# Patient Record
Sex: Female | Born: 1965
Health system: Southern US, Community
[De-identification: ages and names within clinical notes are randomized; demographics above are authoritative.]

## PROBLEM LIST (undated history)

## (undated) DIAGNOSIS — Z923 Personal history of irradiation: Secondary | ICD-10-CM

## (undated) DIAGNOSIS — N289 Disorder of kidney and ureter, unspecified: Secondary | ICD-10-CM

## (undated) DIAGNOSIS — L509 Urticaria, unspecified: Secondary | ICD-10-CM

## (undated) DIAGNOSIS — C801 Malignant (primary) neoplasm, unspecified: Secondary | ICD-10-CM

## (undated) DIAGNOSIS — F32A Depression, unspecified: Secondary | ICD-10-CM

## (undated) DIAGNOSIS — F329 Major depressive disorder, single episode, unspecified: Secondary | ICD-10-CM

## (undated) HISTORY — DX: Urticaria, unspecified: L50.9

## (undated) HISTORY — PX: BREAST LUMPECTOMY: SHX2

## (undated) HISTORY — PX: FINGER SURGERY: SHX640

## (undated) HISTORY — PX: SALPINGOOPHORECTOMY: SHX82

## (undated) HISTORY — PX: KNEE SURGERY: SHX244

---

## 2017-10-04 DIAGNOSIS — Z79811 Long term (current) use of aromatase inhibitors: Secondary | ICD-10-CM | POA: Diagnosis not present

## 2017-10-04 DIAGNOSIS — C50411 Malignant neoplasm of upper-outer quadrant of right female breast: Secondary | ICD-10-CM | POA: Diagnosis not present

## 2017-10-04 DIAGNOSIS — I890 Lymphedema, not elsewhere classified: Secondary | ICD-10-CM | POA: Diagnosis not present

## 2017-10-04 DIAGNOSIS — F418 Other specified anxiety disorders: Secondary | ICD-10-CM | POA: Diagnosis not present

## 2017-10-16 DIAGNOSIS — Z0000 Encounter for general adult medical examination without abnormal findings: Secondary | ICD-10-CM | POA: Diagnosis not present

## 2017-10-16 DIAGNOSIS — Z13220 Encounter for screening for lipoid disorders: Secondary | ICD-10-CM | POA: Diagnosis not present

## 2017-10-16 DIAGNOSIS — E559 Vitamin D deficiency, unspecified: Secondary | ICD-10-CM | POA: Diagnosis not present

## 2017-11-06 DIAGNOSIS — M722 Plantar fascial fibromatosis: Secondary | ICD-10-CM | POA: Diagnosis not present

## 2017-11-06 DIAGNOSIS — M65872 Other synovitis and tenosynovitis, left ankle and foot: Secondary | ICD-10-CM | POA: Diagnosis not present

## 2017-11-14 DIAGNOSIS — C50411 Malignant neoplasm of upper-outer quadrant of right female breast: Secondary | ICD-10-CM | POA: Diagnosis not present

## 2017-11-20 DIAGNOSIS — M722 Plantar fascial fibromatosis: Secondary | ICD-10-CM | POA: Diagnosis not present

## 2017-11-28 DIAGNOSIS — Z1231 Encounter for screening mammogram for malignant neoplasm of breast: Secondary | ICD-10-CM | POA: Diagnosis not present

## 2017-11-30 DIAGNOSIS — M722 Plantar fascial fibromatosis: Secondary | ICD-10-CM | POA: Diagnosis not present

## 2017-11-30 DIAGNOSIS — M65872 Other synovitis and tenosynovitis, left ankle and foot: Secondary | ICD-10-CM | POA: Diagnosis not present

## 2017-12-05 ENCOUNTER — Emergency Department (HOSPITAL_COMMUNITY): Payer: BLUE CROSS/BLUE SHIELD

## 2017-12-05 ENCOUNTER — Emergency Department (HOSPITAL_COMMUNITY)
Admission: EM | Admit: 2017-12-05 | Discharge: 2017-12-05 | Disposition: A | Discharge status: Home / Self Care | Payer: BLUE CROSS/BLUE SHIELD | Attending: Emergency Medicine | Admitting: Emergency Medicine

## 2017-12-05 ENCOUNTER — Encounter (HOSPITAL_COMMUNITY): Payer: Self-pay | Admitting: *Deleted

## 2017-12-05 DIAGNOSIS — M65872 Other synovitis and tenosynovitis, left ankle and foot: Secondary | ICD-10-CM | POA: Diagnosis not present

## 2017-12-05 DIAGNOSIS — J111 Influenza due to unidentified influenza virus with other respiratory manifestations: Secondary | ICD-10-CM

## 2017-12-05 DIAGNOSIS — Z853 Personal history of malignant neoplasm of breast: Secondary | ICD-10-CM | POA: Insufficient documentation

## 2017-12-05 DIAGNOSIS — M722 Plantar fascial fibromatosis: Secondary | ICD-10-CM | POA: Diagnosis not present

## 2017-12-05 DIAGNOSIS — Z79899 Other long term (current) drug therapy: Secondary | ICD-10-CM | POA: Insufficient documentation

## 2017-12-05 DIAGNOSIS — G43909 Migraine, unspecified, not intractable, without status migrainosus: Secondary | ICD-10-CM | POA: Diagnosis not present

## 2017-12-05 DIAGNOSIS — R51 Headache: Secondary | ICD-10-CM | POA: Diagnosis not present

## 2017-12-05 DIAGNOSIS — R69 Illness, unspecified: Secondary | ICD-10-CM

## 2017-12-05 DIAGNOSIS — R519 Headache, unspecified: Secondary | ICD-10-CM

## 2017-12-05 LAB — CSF CELL COUNT WITH DIFFERENTIAL
RBC Count, CSF: 0 /mm3
RBC Count, CSF: 3 /mm3 — ABNORMAL HIGH
TUBE #: 4
Tube #: 1
WBC, CSF: 2 /mm3 (ref 0–5)
WBC, CSF: 2 /mm3 (ref 0–5)

## 2017-12-05 LAB — CBC WITH DIFFERENTIAL/PLATELET
BASOS ABS: 0 10*3/uL (ref 0.0–0.1)
Basophils Relative: 0 %
EOS ABS: 0 10*3/uL (ref 0.0–0.7)
EOS PCT: 0 %
HCT: 37.2 % (ref 36.0–46.0)
Hemoglobin: 12.9 g/dL (ref 12.0–15.0)
Lymphocytes Relative: 8 %
Lymphs Abs: 0.9 10*3/uL (ref 0.7–4.0)
MCH: 32.2 pg (ref 26.0–34.0)
MCHC: 34.7 g/dL (ref 30.0–36.0)
MCV: 92.8 fL (ref 78.0–100.0)
Monocytes Absolute: 1.2 10*3/uL — ABNORMAL HIGH (ref 0.1–1.0)
Monocytes Relative: 11 %
Neutro Abs: 9.2 10*3/uL — ABNORMAL HIGH (ref 1.7–7.7)
Neutrophils Relative %: 81 %
PLATELETS: 182 10*3/uL (ref 150–400)
RBC: 4.01 MIL/uL (ref 3.87–5.11)
RDW: 12.4 % (ref 11.5–15.5)
WBC: 11.4 10*3/uL — AB (ref 4.0–10.5)

## 2017-12-05 LAB — BASIC METABOLIC PANEL
ANION GAP: 9 (ref 5–15)
BUN: 13 mg/dL (ref 6–20)
CO2: 25 mmol/L (ref 22–32)
Calcium: 9 mg/dL (ref 8.9–10.3)
Chloride: 105 mmol/L (ref 101–111)
Creatinine, Ser: 0.86 mg/dL (ref 0.44–1.00)
GFR calc Af Amer: >60 mL/min (ref >60)
Glucose, Bld: 113 mg/dL — ABNORMAL HIGH (ref 65–99)
POTASSIUM: 3.8 mmol/L (ref 3.5–5.1)
SODIUM: 139 mmol/L (ref 135–145)

## 2017-12-05 LAB — PROTEIN AND GLUCOSE, CSF
GLUCOSE CSF: 66 mg/dL (ref 40–70)
TOTAL PROTEIN, CSF: 22 mg/dL (ref 15–45)

## 2017-12-05 MED ORDER — ONDANSETRON 4 MG PO TBDP: 4.0000 mg | ORAL_TABLET | Freq: Once | ORAL | Status: DC

## 2017-12-05 MED ORDER — LIDOCAINE-EPINEPHRINE (PF) 2 %-1:200000 IJ SOLN
INTRAMUSCULAR | Status: AC
  Administered 2017-12-05: 20 mL
  Filled 2017-12-05: qty 20

## 2017-12-05 MED ORDER — DIPHENHYDRAMINE HCL 50 MG/ML IJ SOLN
25.0000 mg | Freq: Once | INTRAMUSCULAR | Status: AC
  Administered 2017-12-05: 25 mg via INTRAVENOUS
  Filled 2017-12-05: qty 1

## 2017-12-05 MED ORDER — SODIUM CHLORIDE 0.9 % IV BOLUS
1000.0000 mL | Freq: Once | INTRAVENOUS | Status: AC
  Administered 2017-12-05: 1000 mL via INTRAVENOUS

## 2017-12-05 MED ORDER — LIDOCAINE-EPINEPHRINE 1 %-1:100000 IJ SOLN
20.0000 mL | Freq: Once | INTRAMUSCULAR | Status: DC
  Filled 2017-12-05: qty 20

## 2017-12-05 MED ORDER — ONDANSETRON 4 MG PO TBDP: ORAL_TABLET | ORAL | 0 refills | Status: DC

## 2017-12-05 MED ORDER — PROCHLORPERAZINE EDISYLATE 10 MG/2ML IJ SOLN
10.0000 mg | Freq: Once | INTRAMUSCULAR | Status: DC
Start: 2017-12-05 — End: 2017-12-06
  Filled 2017-12-05: qty 2

## 2017-12-05 MED ORDER — KETOROLAC TROMETHAMINE 30 MG/ML IJ SOLN
15.0000 mg | Freq: Once | INTRAMUSCULAR | Status: AC
  Administered 2017-12-05: 15 mg via INTRAVENOUS
  Filled 2017-12-05: qty 1

## 2017-12-05 NOTE — ED Triage Notes (Addendum)
Pt complains of migraine x 2 days, neck pain and vomiting starting today. Pt states she has ~2 migraines a month. Pt states she had fever of 101.3 earlier today. Pt tried taking tylenol, but vomited after.

## 2017-12-05 NOTE — ED Provider Notes (Signed)
[facility] DEPT Provider Note   CSN: [id number] Arrival date & time: 12/05/17  1558     History   Chief Complaint Chief Complaint  Patient presents with  . Migraine  . Neck Pain  . Fever    HPI Amanda Martinez is a 52 y.o. female.  52 yo F with a chief complaint of a headache and fever.  The patient's headache started 2 days ago and she relates she had a fever yesterday.  Symptoms have worsened where she has had aches and chills at home.  She is trying to take Tylenol but has been vomiting.  She denies cough or congestion denies abdominal pain denies diarrhea.  Denies sick contacts denies tick bite.  She has a history of migraines but thinks that this feels different, this has more severe photophobia as well as vomiting and the headache is in a different location from her prior.  She does have a history of breast cancer and is on antiestrogen therapy.  She denies trauma.  She feels that the pain radiates down to her neck.  The history is provided by the patient.  Migraine  This is a new problem. The current episode started 2 days ago. The problem occurs constantly. The problem has been rapidly worsening. Associated symptoms include headaches. Pertinent negatives include no chest pain, no abdominal pain and no shortness of breath. Exacerbated by: bright lights. Nothing relieves the symptoms. She has tried acetaminophen for the symptoms. The treatment provided no relief.  Neck Pain   Associated symptoms include headaches. Pertinent negatives include no chest pain.  Fever   Associated symptoms include headaches. Pertinent negatives include no chest pain, no vomiting and no congestion.    History reviewed. No pertinent past medical history.  There are no active problems to display for this patient.   History reviewed. No pertinent surgical history.   OB History   None      Home Medications    Prior to Admission medications   Medication Sig Start  Date End Date Taking? Authorizing Provider  ALFALFA PO Take 5 tablets by mouth 2 (two) times daily.   Yes [provider]  Alpha-Lipoic Acid 200 MG CAPS Take 1 capsule by mouth 3 (three) times daily.   Yes [provider]  anastrozole (ARIMIDEX) 1 MG tablet Take 1 mg by mouth daily.  11/14/17  Yes [provider]  Ascorbic Acid (VITAMIN C PO) Take 1 tablet by mouth 3 (three) times daily. Vitamin C 2 (Sustained Release)   Yes [provider]  b complex vitamins tablet Take 3-4 tablets by mouth 2 (two) times daily. Take 4 tables midday and Take 3 tablets in the evening.   Yes [provider]  BLACK COHOSH PO Take 1 capsule by mouth 2 (two) times daily.   Yes [provider]  CALCIUM PO Take 3 tablets by mouth 2 (two) times daily. Osteomatrix   Yes [provider]  cholecalciferol (VITAMIN D) 1000 units tablet Take 1,000-2,000 Units by mouth 3 (three) times daily. Take 1 tablet in the morning, Take 1 tablet at midday and Take 2 tablets in the evening.   Yes [provider]  citalopram (CELEXA) 40 MG tablet Take 40 mg by mouth daily.  11/20/17  Yes [provider]  EQL EVENING PRIMROSE OIL PO Take 1,300 mg by mouth 2 (two) times daily.    Yes [provider]  GARLIC PO Take 1 tablet by mouth 2 (two) times daily.  Yes [provider]  Iron-Vit C-Vit B12-Folic Acid (IRON 235 PLUS PO) Take 1-2 tablets by mouth 2 (two) times daily. Iron Plus. Take 2 tablets at midday and Take 1 tablet in the evening.   Yes [provider]  LECITHIN PO Take 3 capsules by mouth daily.   Yes [provider]  Melatonin 10 MG TABS Take 1 tablet by mouth at bedtime as needed (sleep).   Yes [provider]  Misc Natural Products (JOINT HEALTH) CAPS Take 1 capsule by mouth 3 (three) times daily.   Yes [provider]  Multiple Vitamins-Minerals (ZINC PO) Take 1 tablet by mouth 2 (two) times daily.    Yes [provider]  Omega-3 1000 MG CAPS Take 6,000 mg by mouth daily.   Yes [provider]  OVER THE COUNTER MEDICATION Take 1 capsule by mouth 2 (two) times daily. Carotomax   Yes [provider]  OVER THE COUNTER MEDICATION Take 1 capsule by mouth 2 (two) times daily. CoQHeart   Yes [provider]  OVER THE COUNTER MEDICATION Take 1 capsule by mouth 2 (two) times daily. CorEnergy   Yes [provider]  OVER THE COUNTER MEDICATION Take 3 capsules by mouth daily. GLA   Yes [provider]  OVER THE COUNTER MEDICATION Take 2 capsules by mouth daily. Immunity Formula   Yes [provider]  OVER THE COUNTER MEDICATION Take 1 capsule by mouth daily. Mindworks   Yes [provider]  OVER THE COUNTER MEDICATION Take 1 capsule by mouth 2 (two) times daily. Optiflora   Yes [provider]  OVER THE COUNTER MEDICATION Take 1 tablet by mouth daily. Vitalized Immunity   Yes [provider]  OVER THE COUNTER MEDICATION Take 1 capsule by mouth 2 (two) times daily. Vivix   Yes [provider]  OVER THE COUNTER MEDICATION Take 1 tablet by mouth 2 (two) times daily. Metabolic Boost   Yes [provider]  OVER THE COUNTER MEDICATION Take 2 tablets by mouth 2 (two) times daily. HerbLax   Yes [provider]  OVER THE COUNTER MEDICATION Take 2 tablets by mouth daily. Mental Activity Plus   Yes [provider]  OVER THE COUNTER MEDICATION Take 2 tablets by mouth 2 (two) times daily. Sleep Aid   Yes [provider]  OVER THE COUNTER MEDICATION Take 2 tablets by mouth 2 (two) times daily. VitaLea   Yes [provider]  OVER THE COUNTER MEDICATION Take 1 tablet by mouth 2 (two) times daily. Metabolic Boost.   Yes [provider]  OVER THE COUNTER MEDICATION Take 2 tablets by mouth every evening. DTX (Liver Detox)   Yes [provider]  OVER THE COUNTER  MEDICATION Take 1 tablet by mouth every evening. Mental Activity Plus   Yes [provider]  OVER THE COUNTER MEDICATION Take 1 tablet by mouth every evening. Nutriferon   Yes [provider]  VITAMIN E PO Take 2 capsules by mouth 2 (two) times daily.   Yes [provider]  ondansetron (ZOFRAN ODT) 4 MG disintegrating tablet 4mg  ODT q4 hours prn nausea/vomit 12/05/17   Deno Etienne, DO    Family History No family history on file.  Social History Social History   Tobacco Use  . Smoking status: Not on file  Substance Use Topics  . Alcohol use: Not on file  . Drug use: Not on file     Allergies   Compazine [prochlorperazine] and  Prilosec [omeprazole]   Review of Systems Review of Systems  Constitutional: Positive for fever. Negative for chills.  HENT: Negative for congestion and rhinorrhea.   Eyes: Negative for redness and visual disturbance.  Respiratory: Negative for shortness of breath and wheezing.   Cardiovascular: Negative for chest pain and palpitations.  Gastrointestinal: Negative for abdominal pain, nausea and vomiting.  Genitourinary: Negative for dysuria and urgency.  Musculoskeletal: Positive for neck pain. Negative for arthralgias and myalgias.  Skin: Negative for pallor and wound.  Neurological: Positive for headaches. Negative for dizziness.     Physical Exam Updated Vital Signs BP 110/67   Pulse 71   Temp 99.8 F (37.7 C) (Oral)   Resp 18   SpO2 100%   Physical Exam  Constitutional: She is oriented to person, place, and time. She appears well-developed and well-nourished. No distress.  HENT:  Head: Normocephalic and atraumatic.  Rhinorrhea, swollen turbinates, posterior nasal drip  Eyes: Pupils are equal, round, and reactive to light. EOM are normal.  Neck: Normal range of motion. Neck supple.  Cardiovascular: Normal rate and regular rhythm. Exam reveals no gallop and no friction rub.  No murmur heard. Pulmonary/Chest:  Effort normal. She has no wheezes. She has no rales.  Abdominal: Soft. She exhibits no distension. There is no tenderness.  Musculoskeletal: She exhibits no edema or tenderness.  Neurological: She is alert and oriented to person, place, and time. She has normal strength. No cranial nerve deficit or sensory deficit. Coordination and gait normal. GCS eye subscore is 4. GCS verbal subscore is 5. GCS motor subscore is 6. She displays no Babinski's sign on the right side. She displays no Babinski's sign on the left side.  Reflex Scores:      Tricep reflexes are 2+ on the right side and 2+ on the left side.      Bicep reflexes are 2+ on the right side and 2+ on the left side.      Brachioradialis reflexes are 2+ on the right side and 2+ on the left side.      Patellar reflexes are 2+ on the right side and 2+ on the left side.      Achilles reflexes are 2+ on the right side and 3+ on the left side. Skin: Skin is warm and dry. She is not diaphoretic.  Psychiatric: She has a normal mood and affect. Her behavior is normal.  Nursing note and vitals reviewed.    ED Treatments / Results  Labs (all labs ordered are listed, but only abnormal results are displayed) Labs Reviewed  CBC WITH DIFFERENTIAL/PLATELET - Abnormal; Notable for the following components:      Result Value   WBC 11.4 (*)    Neutro Abs 9.2 (*)    Monocytes Absolute 1.2 (*)    All other components within normal limits  BASIC METABOLIC PANEL - Abnormal; Notable for the following components:   Glucose, Bld 113 (*)    All other components within normal limits  CSF CELL COUNT WITH DIFFERENTIAL - Abnormal; Notable for the following components:   RBC Count, CSF 3 (*)    All other components within normal limits  CSF CULTURE  CSF CELL COUNT WITH DIFFERENTIAL  PROTEIN AND GLUCOSE, CSF    EKG None  Radiology Ct Head Wo Contrast  Result Date: 12/05/2017 CLINICAL DATA:  Migraine headache x2 days EXAM: CT HEAD WITHOUT CONTRAST  TECHNIQUE: Contiguous axial images were obtained from the base of the skull through the vertex without intravenous  contrast. COMPARISON:  None. FINDINGS: Brain: No evidence of acute infarction, hemorrhage, hydrocephalus, extra-axial collection or mass lesion/mass effect. Vascular: No hyperdense vessel or unexpected calcification. Skull: Normal. Negative for fracture or focal lesion. Sinuses/Orbits: The visualized paranasal sinuses are essentially clear. The mastoid air cells are unopacified. Other: None. IMPRESSION: Normal head CT. Electronically Signed   By: Julian Hy M.D.   On: 12/05/2017 18:37    Procedures .Lumbar Puncture Date/Time: 12/05/2017 8:11 PM Performed by: Deno Etienne, DO Authorized by: Deno Etienne, DO   Consent:    Consent obtained:  Verbal   Consent given by:  Patient   Risks discussed:  Bleeding, infection, headache, nerve damage, pain and repeat procedure   Alternatives discussed:  No treatment, delayed treatment, observation and referral Pre-procedure details:    Procedure purpose:  Diagnostic   Preparation: Patient was prepped and draped in usual sterile fashion   Anesthesia (see MAR for exact dosages):    Anesthesia method:  Local infiltration   Local anesthetic:  Lidocaine 2% WITH epi Procedure details:    Lumbar space:  L4-L5 interspace   Patient position:  Sitting   Needle gauge:  20   Needle type:  Spinal needle - Quincke tip   Needle length (in):  3.5   Ultrasound guidance: no     Number of attempts:  1   Fluid appearance:  Clear   Tubes of fluid:  4   Total volume (ml):  8 Post-procedure:    Puncture site:  Adhesive bandage applied   Patient tolerance of procedure:  Tolerated well, no immediate complications   (including critical care time)  Medications Ordered in ED Medications  prochlorperazine (COMPAZINE) injection 10 mg (10 mg Intravenous Refused 12/05/17 1748)  lidocaine-EPINEPHrine (XYLOCAINE W/EPI) 1 %-1:100000 (with pres) injection 20 mL  (20 mLs Intradermal Not Given 12/05/17 1930)  ondansetron (ZOFRAN-ODT) disintegrating tablet 4 mg (4 mg Oral Not Given 12/05/17 2130)  diphenhydrAMINE (BENADRYL) injection 25 mg (25 mg Intravenous Given 12/05/17 1747)  sodium chloride 0.9 % bolus 1,000 mL (0 mLs Intravenous Stopped 12/05/17 1841)  ketorolac (TORADOL) 30 MG/ML injection 15 mg (15 mg Intravenous Given 12/05/17 1748)  lidocaine-EPINEPHrine (XYLOCAINE W/EPI) 2 %-1:200000 (PF) injection (20 mLs  Given 12/05/17 2001)  sodium chloride 0.9 % bolus 1,000 mL (0 mLs Intravenous Stopped 12/05/17 2131)     Initial Impression / Assessment and Plan / ED Course  I have reviewed the triage vital signs and the nursing notes.  Pertinent labs & imaging results that were available during my care of the patient were reviewed by me and considered in my medical decision making (see chart for details).     52 yo  F with a chief complaint of a headache and a fever.  This been going on for the past couple days.  She endorses no other symptoms.  I discussed with her the concern for possible meningitis.  Will obtain a CT of the head with the patient having history of breast cancer prior to the procedure.  More likely I think she has a viral upper respiratory infection with her swollen turbinates and congestion but she is not endorsing this in her history.  We will treat her with a headache cocktail IV fluids labs CT head and reassess.  Patient feeling better she has a mild leukocytosis.  I discussed again the risks and benefits of performing a lumbar puncture.  She would like me to go ahead with the procedure.  Clear fluid removed sent off to the  lab.  2 wbc seen, gram stain without whites or bacteria.  D/c home, treat as viral uri.  PCP follow up.   10:36 PM:  I have discussed the diagnosis/risks/treatment options with the patient and family and believe the pt to be eligible for discharge home to follow-up with PCP. We also discussed returning to the ED immediately if  new or worsening sx occur. We discussed the sx which are most concerning (e.g., sudden worsening pain, fever, inability to tolerate by mouth) that necessitate immediate return. Medications administered to the patient during their visit and any new prescriptions provided to the patient are listed below.  Medications given during this visit Medications  prochlorperazine (COMPAZINE) injection 10 mg (10 mg Intravenous Refused 12/05/17 1748)  lidocaine-EPINEPHrine (XYLOCAINE W/EPI) 1 %-1:100000 (with pres) injection 20 mL (20 mLs Intradermal Not Given 12/05/17 1930)  ondansetron (ZOFRAN-ODT) disintegrating tablet 4 mg (4 mg Oral Not Given 12/05/17 2130)  diphenhydrAMINE (BENADRYL) injection 25 mg (25 mg Intravenous Given 12/05/17 1747)  sodium chloride 0.9 % bolus 1,000 mL (0 mLs Intravenous Stopped 12/05/17 1841)  ketorolac (TORADOL) 30 MG/ML injection 15 mg (15 mg Intravenous Given 12/05/17 1748)  lidocaine-EPINEPHrine (XYLOCAINE W/EPI) 2 %-1:200000 (PF) injection (20 mLs  Given 12/05/17 2001)  sodium chloride 0.9 % bolus 1,000 mL (0 mLs Intravenous Stopped 12/05/17 2131)    The patient appears reasonably screen and/or stabilized for discharge and I doubt any other medical condition or other [facility] requiring further screening, evaluation, or treatment in the ED at this time prior to discharge.    Final Clinical Impressions(s) / ED Diagnoses   Final diagnoses:  Influenza-like illness  Bad headache    ED Discharge Orders        Ordered    ondansetron (ZOFRAN ODT) 4 MG disintegrating tablet     12/05/17 2119       Deno Etienne, DO 12/05/17 2236

## 2017-12-05 NOTE — Discharge Instructions (Signed)
Take tylenol 2 pills 4 times a day and motrin 4 pills 3 times a day.  Drink plenty of fluids.  Return for worsening shortness of breath, headache, confusion. Follow up with your family doctor.   

## 2017-12-09 LAB — CSF CULTURE: CULTURE: NO GROWTH

## 2017-12-09 LAB — CSF CULTURE W GRAM STAIN: Gram Stain: NONE SEEN

## 2017-12-11 ENCOUNTER — Other Ambulatory Visit: Payer: Self-pay

## 2017-12-11 ENCOUNTER — Encounter (HOSPITAL_COMMUNITY): Payer: Self-pay | Admitting: Emergency Medicine

## 2017-12-11 ENCOUNTER — Emergency Department (HOSPITAL_COMMUNITY): Payer: BLUE CROSS/BLUE SHIELD

## 2017-12-11 ENCOUNTER — Inpatient Hospital Stay (HOSPITAL_COMMUNITY): Payer: BLUE CROSS/BLUE SHIELD

## 2017-12-11 ENCOUNTER — Inpatient Hospital Stay (HOSPITAL_COMMUNITY)
Admission: EM | Admit: 2017-12-11 | Discharge: 2017-12-14 | DRG: 690 | Disposition: A | Discharge status: Home / Self Care | Payer: BLUE CROSS/BLUE SHIELD | Attending: Internal Medicine | Admitting: Internal Medicine

## 2017-12-11 DIAGNOSIS — R17 Unspecified jaundice: Secondary | ICD-10-CM | POA: Diagnosis present

## 2017-12-11 DIAGNOSIS — F329 Major depressive disorder, single episode, unspecified: Secondary | ICD-10-CM | POA: Diagnosis present

## 2017-12-11 DIAGNOSIS — H53149 Visual discomfort, unspecified: Secondary | ICD-10-CM | POA: Diagnosis present

## 2017-12-11 DIAGNOSIS — I959 Hypotension, unspecified: Secondary | ICD-10-CM | POA: Diagnosis present

## 2017-12-11 DIAGNOSIS — Z853 Personal history of malignant neoplasm of breast: Secondary | ICD-10-CM

## 2017-12-11 DIAGNOSIS — R1011 Right upper quadrant pain: Secondary | ICD-10-CM

## 2017-12-11 DIAGNOSIS — A419 Sepsis, unspecified organism: Secondary | ICD-10-CM

## 2017-12-11 DIAGNOSIS — D72829 Elevated white blood cell count, unspecified: Secondary | ICD-10-CM | POA: Diagnosis not present

## 2017-12-11 DIAGNOSIS — R502 Drug induced fever: Secondary | ICD-10-CM | POA: Diagnosis not present

## 2017-12-11 DIAGNOSIS — Z79899 Other long term (current) drug therapy: Secondary | ICD-10-CM

## 2017-12-11 DIAGNOSIS — Z888 Allergy status to other drugs, medicaments and biological substances status: Secondary | ICD-10-CM

## 2017-12-11 DIAGNOSIS — E871 Hypo-osmolality and hyponatremia: Secondary | ICD-10-CM | POA: Diagnosis not present

## 2017-12-11 DIAGNOSIS — R945 Abnormal results of liver function studies: Secondary | ICD-10-CM | POA: Diagnosis not present

## 2017-12-11 DIAGNOSIS — N136 Pyonephrosis: Secondary | ICD-10-CM | POA: Diagnosis not present

## 2017-12-11 DIAGNOSIS — D649 Anemia, unspecified: Secondary | ICD-10-CM | POA: Diagnosis present

## 2017-12-11 DIAGNOSIS — E872 Acidosis: Secondary | ICD-10-CM | POA: Diagnosis not present

## 2017-12-11 DIAGNOSIS — R51 Headache: Secondary | ICD-10-CM | POA: Diagnosis present

## 2017-12-11 DIAGNOSIS — R509 Fever, unspecified: Secondary | ICD-10-CM

## 2017-12-11 DIAGNOSIS — N10 Acute tubulo-interstitial nephritis: Secondary | ICD-10-CM | POA: Diagnosis not present

## 2017-12-11 DIAGNOSIS — Z90721 Acquired absence of ovaries, unilateral: Secondary | ICD-10-CM

## 2017-12-11 DIAGNOSIS — B9620 Unspecified Escherichia coli [E. coli] as the cause of diseases classified elsewhere: Secondary | ICD-10-CM | POA: Diagnosis not present

## 2017-12-11 DIAGNOSIS — N179 Acute kidney failure, unspecified: Secondary | ICD-10-CM

## 2017-12-11 DIAGNOSIS — K5730 Diverticulosis of large intestine without perforation or abscess without bleeding: Secondary | ICD-10-CM | POA: Diagnosis not present

## 2017-12-11 DIAGNOSIS — R000 Tachycardia, unspecified: Secondary | ICD-10-CM | POA: Diagnosis not present

## 2017-12-11 DIAGNOSIS — E876 Hypokalemia: Secondary | ICD-10-CM

## 2017-12-11 DIAGNOSIS — R0600 Dyspnea, unspecified: Secondary | ICD-10-CM | POA: Diagnosis not present

## 2017-12-11 HISTORY — DX: Malignant (primary) neoplasm, unspecified: C80.1

## 2017-12-11 HISTORY — DX: Major depressive disorder, single episode, unspecified: F32.9

## 2017-12-11 HISTORY — DX: Depression, unspecified: F32.A

## 2017-12-11 LAB — COMPREHENSIVE METABOLIC PANEL
ALT: 33 U/L (ref 14–54)
AST: 48 U/L — ABNORMAL HIGH (ref 15–41)
Albumin: 2.6 g/dL — ABNORMAL LOW (ref 3.5–5.0)
Alkaline Phosphatase: 299 U/L — ABNORMAL HIGH (ref 38–126)
Anion gap: 13 (ref 5–15)
BUN: 44 mg/dL — ABNORMAL HIGH (ref 6–20)
CO2: 23 mmol/L (ref 22–32)
Calcium: 9.5 mg/dL (ref 8.9–10.3)
Chloride: 98 mmol/L — ABNORMAL LOW (ref 101–111)
Creatinine, Ser: 3.38 mg/dL — ABNORMAL HIGH (ref 0.44–1.00)
GFR calc Af Amer: 17 mL/min — ABNORMAL LOW (ref >60)
GFR calc non Af Amer: 15 mL/min — ABNORMAL LOW (ref >60)
Glucose, Bld: 86 mg/dL (ref 65–99)
Potassium: 3.4 mmol/L — ABNORMAL LOW (ref 3.5–5.1)
Sodium: 134 mmol/L — ABNORMAL LOW (ref 135–145)
Total Bilirubin: 3.6 mg/dL — ABNORMAL HIGH (ref 0.3–1.2)
Total Protein: 6.7 g/dL (ref 6.5–8.1)

## 2017-12-11 LAB — URINALYSIS, ROUTINE W REFLEX MICROSCOPIC
Bilirubin Urine: NEGATIVE
Glucose, UA: NEGATIVE mg/dL
Ketones, ur: NEGATIVE mg/dL
Nitrite: NEGATIVE
Protein, ur: 30 mg/dL — AB
Specific Gravity, Urine: 1.004 — ABNORMAL LOW (ref 1.005–1.030)
WBC, UA: >50 WBC/hpf — ABNORMAL HIGH (ref 0–5)
pH: 5 (ref 5.0–8.0)

## 2017-12-11 LAB — CBC WITH DIFFERENTIAL/PLATELET
Basophils Absolute: 0 10*3/uL (ref 0.0–0.1)
Basophils Relative: 0 %
Eosinophils Absolute: 0.2 10*3/uL (ref 0.0–0.7)
Eosinophils Relative: 1 %
HCT: 34.2 % — ABNORMAL LOW (ref 36.0–46.0)
Hemoglobin: 12.3 g/dL (ref 12.0–15.0)
Lymphocytes Relative: 6 %
Lymphs Abs: 1.3 10*3/uL (ref 0.7–4.0)
MCH: 32.3 pg (ref 26.0–34.0)
MCHC: 36 g/dL (ref 30.0–36.0)
MCV: 89.8 fL (ref 78.0–100.0)
Monocytes Absolute: 1.5 10*3/uL — ABNORMAL HIGH (ref 0.1–1.0)
Monocytes Relative: 7 %
Neutro Abs: 19.1 10*3/uL — ABNORMAL HIGH (ref 1.7–7.7)
Neutrophils Relative %: 86 %
Platelets: 285 10*3/uL (ref 150–400)
RBC: 3.81 MIL/uL — ABNORMAL LOW (ref 3.87–5.11)
RDW: 13.6 % (ref 11.5–15.5)
WBC: 22.1 10*3/uL — ABNORMAL HIGH (ref 4.0–10.5)

## 2017-12-11 LAB — LIPASE, BLOOD: Lipase: 31 U/L (ref 11–51)

## 2017-12-11 LAB — APTT: aPTT: 25 seconds (ref 24–36)

## 2017-12-11 LAB — MAGNESIUM: MAGNESIUM: 2.1 mg/dL (ref 1.7–2.4)

## 2017-12-11 LAB — PROTIME-INR
INR: 1.06
PROTHROMBIN TIME: 13.7 s (ref 11.4–15.2)

## 2017-12-11 LAB — POC URINE PREG, ED: Preg Test, Ur: NEGATIVE

## 2017-12-11 LAB — LACTIC ACID, PLASMA: LACTIC ACID, VENOUS: 1.4 mmol/L (ref 0.5–1.9)

## 2017-12-11 LAB — PROCALCITONIN: PROCALCITONIN: 5.52 ng/mL

## 2017-12-11 MED ORDER — ONDANSETRON HCL 4 MG PO TABS: 4.0000 mg | ORAL_TABLET | Freq: Four times a day (QID) | ORAL | Status: DC | PRN

## 2017-12-11 MED ORDER — ACETAMINOPHEN 650 MG RE SUPP: 650.0000 mg | Freq: Four times a day (QID) | RECTAL | Status: DC | PRN

## 2017-12-11 MED ORDER — HEPARIN SODIUM (PORCINE) 5000 UNIT/ML IJ SOLN
5000.0000 [IU] | Freq: Three times a day (TID) | INTRAMUSCULAR | Status: DC
  Administered 2017-12-11 – 2017-12-13 (×5): 5000 [IU] via SUBCUTANEOUS
  Filled 2017-12-11 (×6): qty 1

## 2017-12-11 MED ORDER — BOOST / RESOURCE BREEZE PO LIQD CUSTOM
1.0000 | Freq: Three times a day (TID) | ORAL | Status: DC
  Administered 2017-12-12 – 2017-12-13 (×5): 1 via ORAL

## 2017-12-11 MED ORDER — POTASSIUM CHLORIDE IN NACL 20-0.9 MEQ/L-% IV SOLN
INTRAVENOUS | Status: DC
  Administered 2017-12-11 – 2017-12-14 (×5): via INTRAVENOUS
  Filled 2017-12-11 (×8): qty 1000

## 2017-12-11 MED ORDER — SODIUM CHLORIDE 0.9 % IV BOLUS
500.0000 mL | Freq: Once | INTRAVENOUS | Status: AC
  Administered 2017-12-11: 500 mL via INTRAVENOUS

## 2017-12-11 MED ORDER — SODIUM CHLORIDE 0.9 % IV SOLN
2.0000 g | INTRAVENOUS | Status: DC
  Administered 2017-12-11: 2 g via INTRAVENOUS
  Filled 2017-12-11: qty 2

## 2017-12-11 MED ORDER — GUAIFENESIN-DM 100-10 MG/5ML PO SYRP: 5.0000 mL | ORAL_SOLUTION | ORAL | Status: DC | PRN

## 2017-12-11 MED ORDER — SODIUM CHLORIDE 0.9 % IV BOLUS
1000.0000 mL | Freq: Once | INTRAVENOUS | Status: AC
  Administered 2017-12-11: 1000 mL via INTRAVENOUS

## 2017-12-11 MED ORDER — ACETAMINOPHEN 500 MG PO TABS: 500.0000 mg | ORAL_TABLET | Freq: Four times a day (QID) | ORAL | Status: DC | PRN

## 2017-12-11 MED ORDER — ONDANSETRON HCL 4 MG/2ML IJ SOLN: 4.0000 mg | Freq: Four times a day (QID) | INTRAMUSCULAR | Status: DC | PRN

## 2017-12-11 MED ORDER — TECHNETIUM TC 99M MEBROFENIN IV KIT
7.8300 | PACK | Freq: Once | INTRAVENOUS | Status: AC | PRN
  Administered 2017-12-11: 7.83 via INTRAVENOUS

## 2017-12-11 MED ORDER — SODIUM CHLORIDE 0.9 % IV SOLN: INTRAVENOUS | Status: DC

## 2017-12-11 MED ORDER — FAMOTIDINE IN NACL 20-0.9 MG/50ML-% IV SOLN
20.0000 mg | Freq: Once | INTRAVENOUS | Status: AC
  Administered 2017-12-11: 20 mg via INTRAVENOUS
  Filled 2017-12-11: qty 50

## 2017-12-11 MED ORDER — ACETAMINOPHEN 325 MG PO TABS
650.0000 mg | ORAL_TABLET | Freq: Four times a day (QID) | ORAL | Status: DC | PRN
  Administered 2017-12-13: 650 mg via ORAL
  Filled 2017-12-11: qty 2

## 2017-12-11 MED ORDER — SODIUM CHLORIDE 0.9 % IV SOLN
1.0000 g | Freq: Once | INTRAVENOUS | Status: DC
  Filled 2017-12-11: qty 10

## 2017-12-11 MED ORDER — MORPHINE 100MG IN NS 100ML (1MG/ML) PREMIX INFUSION: 3.0000 mg/h | Freq: Once | INTRAVENOUS | Status: DC

## 2017-12-11 MED ORDER — MORPHINE SULFATE (PF) 4 MG/ML IV SOLN
3.0000 mg | Freq: Once | INTRAVENOUS | Status: AC
  Administered 2017-12-11: 3 mg via INTRAVENOUS
  Filled 2017-12-11: qty 1

## 2017-12-11 MED ORDER — CITALOPRAM HYDROBROMIDE 20 MG PO TABS
40.0000 mg | ORAL_TABLET | Freq: Every day | ORAL | Status: DC
  Administered 2017-12-12 – 2017-12-14 (×3): 40 mg via ORAL
  Filled 2017-12-11 (×3): qty 2

## 2017-12-11 NOTE — ED Notes (Signed)
Family at bedside. 

## 2017-12-11 NOTE — ED Triage Notes (Signed)
Pt reports that last Wed, day after taking Toradol, she been having itching, fever with headache, abd distention, difficulty breathing, constipation, fatigue and confusion -"like when a direct question is asked to me , I have to think about it".. States that believes these are all side effects of Toradol. Pt able to speak in full sentences

## 2017-12-11 NOTE — ED Provider Notes (Signed)
[facility] DEPT Provider Note   CSN: [id number] Arrival date & time: 12/11/17  7681     History   Chief Complaint Chief Complaint  Patient presents with  . multiple complaints    HPI Amanda Martinez is a 52 y.o. female with history of breast cancer in remission and depression who presents today for evaluation of gradual onset, progressively worsening shortness of breath, abdominal pain, nonproductive cough, and headaches for 5 days.  She was seen and evaluated in the emergency department last Wednesday 6 days ago but states her current symptoms are unrelated.  At that time she underwent an LP to rule out possible meningitis and was found to be stable for discharge home at the time.  Since that time she and her husband have been traveling to [facility], [place], and back to [place].  She developed nausea, vomiting, and diarrhea on Thursday which resolved after 3 days.  She states she contacted her primary care PA at the time who thought that she may have had a gastroenteritis.  She states that the symptoms have improved with BRATdiet.  Presently she is complaining of shortness of breath which worsens with exertion, dull right upper quadrant abdominal pain which does not radiate and worsens with palpation, and low-grade fever.  She states that her normal body temperature is 97.4 F and she has been running an elevated temperature of no more than 99 F.  This is well controlled with ibuprofen and Tylenol.  She notes intermittent retro-orbital headache with associated photophobia which is different from her usual headaches and is dull and throbbing in nature.  Headache worsens with cough.  Cough is nonproductive.  She denies chest pain, urinary urgency, dysuria, hematuria, melena, or hematochezia.  She notes that she has had malodorous urine for 2 days or so and has had right-sided aching low back pain for 10 days.  Pain does not radiate. She also notes that  she feels itchy.  She thinks that her symptoms may have been a result of Toradol which she received in the ED 6 days ago.  She is a non-smoker, does not drink alcohol, and denies recreational drug use.  The history is provided by the patient.    Past Medical History:  Diagnosis Date  . Cancer [facility])    breast   . Depression     Patient Active Problem List   Diagnosis Date Noted  . Acute pyelonephritis 12/11/2017  . AKI (acute kidney injury) ([employer]) 12/11/2017  . Hyponatremia 12/11/2017  . Hypokalemia 12/11/2017    Past Surgical History:  Procedure Laterality Date  . BREAST LUMPECTOMY    . FINGER SURGERY Right    little finger  . KNEE SURGERY Right   . SALPINGOOPHORECTOMY       OB History   None      Home Medications    Prior to Admission medications   Medication Sig Start Date End Date Taking? Authorizing Provider  acetaminophen (TYLENOL) 500 MG tablet Take 500 mg by mouth every 6 (six) hours as needed for headache.   Yes [provider]  ALFALFA PO Take 5 tablets by mouth 2 (two) times daily.   Yes [provider]  Alpha-Lipoic Acid 200 MG CAPS Take 1 capsule by mouth 3 (three) times daily.   Yes [provider]  anastrozole (ARIMIDEX) 1 MG tablet Take 1 mg by mouth daily.  11/14/17  Yes [provider]  Ascorbic Acid (VITAMIN C PO) Take 1 tablet by  mouth 3 (three) times daily. Vitamin C 2 (Sustained Release)   Yes [provider]  b complex vitamins tablet Take 3-4 tablets by mouth 2 (two) times daily. Take 4 tables midday and Take 3 tablets in the evening.   Yes [provider]  BLACK COHOSH PO Take 1 capsule by mouth 2 (two) times daily.   Yes [provider]  CALCIUM PO Take 3 tablets by mouth 2 (two) times daily. Osteomatrix   Yes [provider]  cholecalciferol (VITAMIN D) 1000 units tablet Take 1,000-2,000 Units by mouth 3 (three) times daily. Take 1 tablet in the morning, Take 1 tablet at  midday and Take 2 tablets in the evening.   Yes [provider]  citalopram (CELEXA) 40 MG tablet Take 40 mg by mouth daily.  11/20/17  Yes [provider]  EQL EVENING PRIMROSE OIL PO Take 1,300 mg by mouth 2 (two) times daily.    Yes [provider]  GARLIC PO Take 1 tablet by mouth 2 (two) times daily.   Yes [provider]  ibuprofen (ADVIL,MOTRIN) 200 MG tablet Take 200 mg by mouth every 6 (six) hours as needed for headache.   Yes [provider]  Iron-Vit C-Vit B12-Folic Acid (IRON 573 PLUS PO) Take 1-2 tablets by mouth 2 (two) times daily. Iron Plus. Take 2 tablets at midday and Take 1 tablet in the evening.   Yes [provider]  LECITHIN PO Take 3 capsules by mouth daily.   Yes [provider]  Misc Natural Products (JOINT HEALTH) CAPS Take 1 capsule by mouth 3 (three) times daily.   Yes [provider]  Multiple Vitamins-Minerals (ZINC PO) Take 1 tablet by mouth 2 (two) times daily.   Yes [provider]  Omega-3 1000 MG CAPS Take 6,000 mg by mouth daily.   Yes [provider]  OVER THE COUNTER MEDICATION Take 1 capsule by mouth 2 (two) times daily. Carotomax   Yes [provider]  OVER THE COUNTER MEDICATION Take 1 capsule by mouth 2 (two) times daily. CoQHeart   Yes [provider]  OVER THE COUNTER MEDICATION Take 1 capsule by mouth 2 (two) times daily. CorEnergy   Yes [provider]  OVER THE COUNTER MEDICATION Take 3 capsules by mouth daily. GLA   Yes [provider]  OVER THE COUNTER MEDICATION Take 2 capsules by mouth daily. Immunity Formula   Yes [provider]  OVER THE COUNTER MEDICATION Take 1 capsule by mouth daily. Mindworks   Yes [provider]  OVER THE COUNTER MEDICATION Take 1 capsule by mouth 2 (two) times daily. Optiflora   Yes [provider]  OVER THE COUNTER MEDICATION Take 1 tablet by mouth daily. Vitalized  Immunity   Yes [provider]  OVER THE COUNTER MEDICATION Take 1 capsule by mouth 2 (two) times daily. Vivix   Yes [provider]  OVER THE COUNTER MEDICATION Take 1 tablet by mouth 2 (two) times daily. Metabolic Boost   Yes [provider]  OVER THE COUNTER MEDICATION Take 2 tablets by mouth 2 (two) times daily. HerbLax   Yes [provider]  OVER THE COUNTER MEDICATION Take 2 tablets by mouth daily. Mental Activity Plus   Yes [provider]  OVER THE COUNTER MEDICATION Take 2 tablets by mouth 2 (two) times daily. Sleep Aid   Yes [provider]  OVER THE COUNTER MEDICATION Take 2 tablets by mouth 2 (two) times  daily. VitaLea   Yes [provider]  OVER THE COUNTER MEDICATION Take 2 tablets by mouth every evening. DTX (Liver Detox)   Yes [provider]  OVER THE COUNTER MEDICATION Take 1 tablet by mouth every evening. Mental Activity Plus   Yes [provider]  OVER THE COUNTER MEDICATION Take 1 tablet by mouth every evening. Nutriferon   Yes [provider]  VITAMIN E PO Take 2 capsules by mouth 2 (two) times daily.   Yes [provider]  Melatonin 10 MG TABS Take 1 tablet by mouth at bedtime as needed (sleep).    [provider]  ondansetron (ZOFRAN ODT) 4 MG disintegrating tablet 50m ODT q4 hours prn nausea/vomit 12/05/17   FDeno Etienne DO    Family History No family history on file.  Social History Social History   Tobacco Use  . Smoking status: Never Smoker  . Smokeless tobacco: Never Used  Substance Use Topics  . Alcohol use: Not Currently  . Drug use: Not on file     Allergies   Compazine [prochlorperazine] and Prilosec [omeprazole]   Review of Systems Review of Systems  Constitutional: Positive for fever.  Eyes: Positive for photophobia.  Respiratory: Positive for cough and shortness of breath.   Cardiovascular: Negative for chest pain and leg swelling.    Gastrointestinal: Positive for abdominal distention, abdominal pain, diarrhea (resolved), nausea (resolved) and vomiting (resolved).       +belching  Genitourinary: Negative for dysuria, frequency, hematuria and urgency.  Neurological: Positive for headaches. Negative for syncope and weakness.  All other systems reviewed and are negative.    Physical Exam Updated Vital Signs BP 114/71   Pulse 74   Temp 98.4 F (36.9 C) (Oral)   Resp 11   SpO2 98%   Physical Exam  Constitutional: She is oriented to person, place, and time. She appears well-developed and well-nourished. No distress.  Resting in bed, frequent belching   HENT:  Head: Normocephalic and atraumatic.  Eyes: Pupils are equal, round, and reactive to light. Conjunctivae and EOM are normal. Right eye exhibits no discharge. Left eye exhibits no discharge. No scleral icterus.  Neck: Normal range of motion and full passive range of motion without pain. Neck supple. No JVD present. No neck rigidity. No tracheal deviation present. No Brudzinski's sign and no Kernig's sign noted.  Cardiovascular: Normal rate, regular rhythm, normal heart sounds and intact distal pulses.  2+ radial and DP/PT pulses bl, negative Homan's bl, no LE edema  Pulmonary/Chest: Effort normal and breath sounds normal. No stridor. No respiratory distress. She has no wheezes. She has no rales.  Equal rise and fall of chest, no increased work of breathing.  Abdominal: Soft. Bowel sounds are normal. She exhibits no distension. There is tenderness in the right upper quadrant and epigastric area. There is no rigidity, no rebound and no guarding.  Generalized tenderness to palpation, maximally tender in the right upper quadrant and epigastric regions.  Murphy sign absent, Rovsing's absent, no tenderness to palpation at McBurney's point.  No CVA tenderness.  Musculoskeletal: Normal range of motion. She exhibits no edema or tenderness.  No midline spine TTP, no  paraspinal muscle tenderness, no deformity, crepitus, or step-off noted   Neurological: She is alert and oriented to person, place, and time. No cranial nerve deficit or sensory deficit. She exhibits normal muscle tone.  Mental Status:  Alert, thought content appropriate, able to give a coherent history. Speech fluent without evidence of aphasia. Able  to follow 2 step commands without difficulty.  Cranial Nerves:  II:  Peripheral visual fields grossly normal, pupils equal, round, reactive to light III,IV, VI: ptosis not present, extra-ocular motions intact bilaterally  V,VII: smile symmetric, facial light touch sensation equal VIII: hearing grossly normal to voice  X: uvula elevates symmetrically  XI: bilateral shoulder shrug symmetric and strong XII: midline tongue extension without fassiculations Motor:  Normal tone. 5/5 strength of BUE and BLE major muscle groups including strong and equal grip strength and dorsiflexion/plantar flexion Sensory: light touch normal in all extremities. Cerebellar: normal finger-to-nose with bilateral upper extremities Gait: normal gait and balance. Able to walk on toes and heels with ease.  No pronator drift, no nystasmus.    Skin: Skin is warm and dry. No erythema.  Mild jaundice  Psychiatric: She has a normal mood and affect. Her behavior is normal.  Nursing note and vitals reviewed.    ED Treatments / Results  Labs (all labs ordered are listed, but only abnormal results are displayed) Labs Reviewed  CBC WITH DIFFERENTIAL/PLATELET - Abnormal; Notable for the following components:      Result Value   WBC 22.1 (*)    RBC 3.81 (*)    HCT 34.2 (*)    Neutro Abs 19.1 (*)    Monocytes Absolute 1.5 (*)    All other components within normal limits  COMPREHENSIVE METABOLIC PANEL - Abnormal; Notable for the following components:   Sodium 134 (*)    Potassium 3.4 (*)    Chloride 98 (*)    BUN 44 (*)    Creatinine, Ser 3.38 (*)    Albumin 2.6 (*)      AST 48 (*)    Alkaline Phosphatase 299 (*)    Total Bilirubin 3.6 (*)    GFR calc non Af Amer 15 (*)    GFR calc Af Amer 17 (*)    All other components within normal limits  URINALYSIS, ROUTINE W REFLEX MICROSCOPIC - Abnormal; Notable for the following components:   APPearance CLOUDY (*)    Specific Gravity, Urine 1.004 (*)    Hgb urine dipstick MODERATE (*)    Protein, ur 30 (*)    Leukocytes, UA LARGE (*)    WBC, UA >50 (*)    Bacteria, UA MANY (*)    All other components within normal limits  URINE CULTURE  URINE CULTURE  CULTURE, BLOOD (ROUTINE X 2)  CULTURE, BLOOD (ROUTINE X 2)  LIPASE, BLOOD  HEPATITIS PANEL, ACUTE  HIV ANTIBODY (ROUTINE TESTING)  MAGNESIUM  HEMOGLOBIN A1C  LACTIC ACID, PLASMA  LACTIC ACID, PLASMA  PROCALCITONIN  PROTIME-INR  APTT  CORTISOL  POC URINE PREG, ED    EKG EKG Interpretation  Date/Time:  Tuesday Dec 11 2017 09:39:51 EDT Ventricular Rate:  106 PR Interval:    QRS Duration: 88 QT Interval:  346 QTC Calculation: 460 R Axis:   -37 Text Interpretation:  Sinus tachycardia Probable left atrial enlargement Left axis deviation No old tracing to compare Confirmed by Daleen Bo [phone]) on 12/11/2017 9:54:03 AM   Radiology Dg Chest 2 View  Result Date: 12/11/2017 CLINICAL DATA:  Possible reaction to taking Toradol resulting and difficulty breathing and confusion. EXAM: CHEST - 2 VIEW COMPARISON:  None in PACs FINDINGS: The lungs are adequately inflated. There is no focal infiltrate. There is no pleural effusion. The heart is top-normal in size. The pulmonary vascularity is normal. The mediastinum is normal in width. The trachea is midline. The bony thorax  exhibits no acute abnormality. There is widening of the right AC joint. IMPRESSION: There is no active cardiopulmonary disease. Electronically Signed   By: David  Martinique M.D.   On: 12/11/2017 14:34   Ct Renal Stone Study  Result Date: 12/11/2017 CLINICAL DATA:  Itching and fever.  Headache. Abdominal distention. Elevated creatinine. EXAM: CT ABDOMEN AND PELVIS WITHOUT CONTRAST TECHNIQUE: Multidetector CT imaging of the abdomen and pelvis was performed following the standard protocol without IV contrast. COMPARISON:  None. FINDINGS: Lower chest: No acute abnormality. Hepatobiliary: Probable small cyst in the liver on series 2, image 26, too small to completely characterize. The liver and gallbladder are otherwise normal. Pancreas: Unremarkable. No pancreatic ductal dilatation or surrounding inflammatory changes. Spleen: Normal in size without focal abnormality. Adrenals/Urinary Tract: The adrenal glands are normal. The patient has a horseshoe kidney. There is fat stranding adjacent to both renal moieties, extending down the right pericolic gutter. No hydronephrosis on the right. Mild hydronephrosis on the left is identified with a prominent extrarenal pelvis. No ureterectasis or ureteral stones are identified. Calcifications in the lower abdomen and pelvis appear to be adjacent to rather than within the ureters. The bladder is normal. Stomach/Bowel: The stomach and small bowel are normal. A few scattered colonic diverticuli are seen without diverticulitis. The colon is poorly evaluated due to lack of distention but no obvious abnormalities are noted. The appendix is not seen but there is no secondary evidence of appendicitis. Vascular/Lymphatic: The abdominal aorta is normal in caliber. No atherosclerosis. No lymphadenopathy. Reproductive: Uterus and bilateral adnexa are unremarkable. Other: No free air or free fluid. Musculoskeletal: No acute or significant osseous findings. IMPRESSION: 1. There is fat stranding in the lower abdomen and pelvis, particularly on the right, thought to arise from the kidneys. The lack of hydronephrosis on the right in addition to the lack of renal stones would suggests this stranding is probably not due to a recently passed stone. Pyelonephritis should be  considered. Recommend correlation with urinalysis and physical exam. 2. There is mild hydronephrosis on the left. No obstructing stones are seen. The finding could be due to a UPJ obstruction. The fact that the perinephric stranding is greater on the right than the left suggests that there is not an acute obstruction on the left. Recommend clinical correlation. 3. Scattered colonic diverticuli without diverticulitis. 4. The appendix is not visualized but there is no secondary evidence of appendicitis. Electronically Signed   By: Dorise Bullion III M.D   On: 12/11/2017 16:09   US Abdomen Limited Ruq  Result Date: 12/11/2017 CLINICAL DATA:  Right upper quadrant pain EXAM: ULTRASOUND ABDOMEN LIMITED RIGHT UPPER QUADRANT COMPARISON:  None. FINDINGS: Gallbladder: No gallstones or wall thickening visualized. No sonographic Murphy sign noted by sonographer. Common bile duct: Diameter: 6 mm in caliber. Liver: Simple cyst in the left lobe measures 1.2 cm. No solid mass. Normal in echogenicity. Portal vein is patent on color Doppler imaging with normal direction of blood flow towards the liver. IMPRESSION: No acute pathology involving the liver, gallbladder, or biliary tree. Electronically Signed   By: Marybelle Killings M.D.   On: 12/11/2017 14:55    Procedures Procedures (including critical care time)  Medications Ordered in ED Medications  heparin injection 5,000 Units (has no administration in time range)  acetaminophen (TYLENOL) tablet 650 mg (has no administration in time range)    Or  acetaminophen (TYLENOL) suppository 650 mg (has no administration in time range)  ondansetron (ZOFRAN) tablet 4 mg (has no administration  in time range)    Or  ondansetron (ZOFRAN) injection 4 mg (has no administration in time range)  cefTRIAXone (ROCEPHIN) 2 g in sodium chloride 0.9 % 100 mL IVPB (has no administration in time range)  0.9 % NaCl with KCl 20 mEq/ L  infusion (has no administration in time range)  sodium  chloride 0.9 % bolus 1,000 mL (0 mLs Intravenous Stopped 12/11/17 1631)  famotidine (PEPCID) IVPB 20 mg premix (0 mg Intravenous Stopped 12/11/17 1503)     Initial Impression / Assessment and Plan / ED Course  I have reviewed the triage vital signs and the nursing notes.  Pertinent labs & imaging results that were available during my care of the patient were reviewed by me and considered in my medical decision making (see chart for details).    Patient presents with multiple symptoms including right-sided flank and abdominal pain and resolving nausea, vomiting, and diarrhea.  She also noted shortness of breath but no chest pain.  She is afebrile, mildly hypotensive in the ED with improvement after fluid bolus and ministration.  She does look dehydrated with dry mucous membranes.  Initial EKG showed sinus tachycardia, repeat was normal sinus rhythm with no evidence of ischemic changes.  Chest x-ray shows no acute cardiopulmonary abnormalities.  We will obtain lab work and right upper quadrant ultrasound for further evaluation.  Right upper quadrant ultrasound shows no acute abnormalities to the liver, gallbladder, or biliary tree.  However lab work reviewed by me significant for a leukocytosis of 22.1 and significantly elevated creatinine as compared to her lab work 6 days ago. Today creatinine is 3.38, 0.86 six days ago.  She does have elevated alk phos, total bilirubin, and mildly elevated AST although it is unclear if this is chronic or acute given that lab work 3 days ago did not include LFTs.  Her history is somewhat concerning for hepatitis, we will add a hepatitis panel.  UA suggestive of UTI, will culture. We will also obtain CT renal stone study to evaluate for possible pyelonephritis versus kidney stones.  CT renal stone study shows changes consistent with pyelonephritis.  Will start the patient on Rocephin.  She will require admission given AKI.  Spoke with Dr. Allyson Sabal with [facility] who agrees to assume care of patient and bring her into the hospital for further evaluation and management.  Patient seen and evaluated by Dr. Alvino Chapel who agrees with assessment and plan at this time.   Final Clinical Impressions(s) / ED Diagnoses   Final diagnoses:  RUQ pain  Acute pyelonephritis    ED Discharge Orders    None       Debroah Baller 12/11/17 1655    Davonna Belling, MD 12/12/17 2357

## 2017-12-11 NOTE — ED Notes (Signed)
Bed: WA08 Expected date:  Expected time:  Means of arrival:  Comments: 

## 2017-12-11 NOTE — ED Notes (Signed)
ED TO INPATIENT HANDOFF REPORT  Name/Age/Gender Amanda Martinez 52 y.o. female  Code Status    Code Status Orders  (From admission, onward)        Start     Ordered   12/11/17 1630  Full code  Continuous     12/11/17 1631    Code Status History    This patient has a current code status but no historical code status.      Home/SNF/Other Home  Chief Complaint SHOB weakness  itiching  Level of Care/Admitting Diagnosis ED Disposition    ED Disposition Condition Comment   Admit  Hospital Area: [facility] [[id number]]  Level of Care: Telemetry [5]  Admit to tele based on following criteria: Other see comments  Comments: electrolyte abnormalities  Diagnosis: Acute pyelonephritis [299242]  Admitting Physician: Reyne Dumas [[id number]]  Attending Physician: Reyne Dumas [[id number]]  Estimated length of stay: past midnight tomorrow  Certification:: I certify this patient will need inpatient services for at least 2 midnights  PT Class (Do Not Modify): Inpatient [101]  PT Acc Code (Do Not Modify): Private [1]       Medical History Past Medical History:  Diagnosis Date  . Cancer (HCC)    breast   . Depression     Allergies Allergies  Allergen Reactions  . Compazine [Prochlorperazine] Hives  . Prilosec [Omeprazole] Hives    IV Location/Drains/Wounds Patient Lines/Drains/Airways Status   Active Line/Drains/Airways    Name:   Placement date:   Placement time:   Site:   Days:   Peripheral IV 12/11/17 Left Antecubital   12/11/17    1400    Antecubital   less than 1          Labs/Imaging Results for orders placed or performed during the hospital encounter of 12/11/17 (from the past 48 hour(s))  Urinalysis, Routine w reflex microscopic     Status: Abnormal   Collection Time: 12/11/17  1:34 PM  Result Value Ref Range   Color, Urine YELLOW YELLOW   APPearance CLOUDY (A) CLEAR   Specific Gravity, Urine 1.004 (L) 1.005 - 1.030   pH 5.0 5.0 - 8.0   Glucose, UA NEGATIVE NEGATIVE mg/dL   Hgb urine dipstick MODERATE (A) NEGATIVE   Bilirubin Urine NEGATIVE NEGATIVE   Ketones, ur NEGATIVE NEGATIVE mg/dL   Protein, ur 30 (A) NEGATIVE mg/dL   Nitrite NEGATIVE NEGATIVE   Leukocytes, UA LARGE (A) NEGATIVE   RBC / HPF 0-5 0 - 5 RBC/hpf   WBC, UA >50 (H) 0 - 5 WBC/hpf   Bacteria, UA MANY (A) NONE SEEN   Squamous Epithelial / LPF 0-5 0 - 5    Comment: Please note change in reference range.   Mucus PRESENT     Comment: Performed at [facility], [place] [address] [place], [place] [address]  POC urine preg, ED     Status: None   Collection Time: 12/11/17  1:44 PM  Result Value Ref Range   Preg Test, Ur NEGATIVE NEGATIVE    Comment:        THE SENSITIVITY OF THIS METHODOLOGY IS >24 mIU/mL   CBC with Differential     Status: Abnormal   Collection Time: 12/11/17  2:00 PM  Result Value Ref Range   WBC 22.1 (H) 4.0 - 10.5 K/uL   RBC 3.81 (L) 3.87 - 5.11 MIL/uL   Hemoglobin 12.3 12.0 - 15.0 g/dL   HCT 34.2 (L) 36.0 - 46.0 %   MCV  89.8 78.0 - 100.0 fL   MCH 32.3 26.0 - 34.0 pg   MCHC 36.0 30.0 - 36.0 g/dL   RDW 13.6 11.5 - 15.5 %   Platelets 285 150 - 400 K/uL   Neutrophils Relative % 86 %   Lymphocytes Relative 6 %   Monocytes Relative 7 %   Eosinophils Relative 1 %   Basophils Relative 0 %   Neutro Abs 19.1 (H) 1.7 - 7.7 K/uL   Lymphs Abs 1.3 0.7 - 4.0 K/uL   Monocytes Absolute 1.5 (H) 0.1 - 1.0 K/uL   Eosinophils Absolute 0.2 0.0 - 0.7 K/uL   Basophils Absolute 0.0 0.0 - 0.1 K/uL   WBC Morphology MILD LEFT SHIFT (1-5% METAS, OCC MYELO, OCC BANDS)     Comment: TOXIC GRANULATION Performed at [facility], [place] [address] [place], [place] [address]   Comprehensive metabolic panel     Status: Abnormal   Collection Time: 12/11/17  2:00 PM  Result Value Ref Range   Sodium 134 (L) 135 - 145 mmol/L   Potassium 3.4 (L) 3.5 - 5.1 mmol/L   Chloride 98 (L) 101 - 111 mmol/L   CO2 23 22 - 32 mmol/L    Glucose, Bld 86 65 - 99 mg/dL   BUN 44 (H) 6 - 20 mg/dL   Creatinine, Ser 3.38 (H) 0.44 - 1.00 mg/dL   Calcium 9.5 8.9 - 10.3 mg/dL   Total Protein 6.7 6.5 - 8.1 g/dL   Albumin 2.6 (L) 3.5 - 5.0 g/dL   AST 48 (H) 15 - 41 U/L   ALT 33 14 - 54 U/L   Alkaline Phosphatase 299 (H) 38 - 126 U/L   Total Bilirubin 3.6 (H) 0.3 - 1.2 mg/dL   GFR calc non Af Amer 15 (L) >60 mL/min   GFR calc Af Amer 17 (L) >60 mL/min    Comment: (NOTE) The eGFR has been calculated using the CKD EPI equation. This calculation has not been validated in all clinical situations. eGFR's persistently <60 mL/min signify possible Chronic Kidney Disease.    Anion gap 13 5 - 15    Comment: Performed at [facility], [place] [address], [place], [place] [address]  Lipase, blood     Status: None   Collection Time: 12/11/17  2:00 PM  Result Value Ref Range   Lipase 31 11 - 51 U/L    Comment: Performed at [facility], [place] [address] [place], [place] [address]   Dg Chest 2 View  Result Date: 12/11/2017 CLINICAL DATA:  Possible reaction to taking Toradol resulting and difficulty breathing and confusion. EXAM: CHEST - 2 VIEW COMPARISON:  None in PACs FINDINGS: The lungs are adequately inflated. There is no focal infiltrate. There is no pleural effusion. The heart is top-normal in size. The pulmonary vascularity is normal. The mediastinum is normal in width. The trachea is midline. The bony thorax exhibits no acute abnormality. There is widening of the right AC joint. IMPRESSION: There is no active cardiopulmonary disease. Electronically Signed   By: David  Martinique M.D.   On: 12/11/2017 14:34   Ct Renal Stone Study  Result Date: 12/11/2017 CLINICAL DATA:  Itching and fever. Headache. Abdominal distention. Elevated creatinine. EXAM: CT ABDOMEN AND PELVIS WITHOUT CONTRAST TECHNIQUE: Multidetector CT imaging of the abdomen and pelvis was performed following the standard protocol without IV contrast.  COMPARISON:  None. FINDINGS: Lower chest: No acute abnormality. Hepatobiliary: Probable small cyst in the liver on series 2,  image 26, too small to completely characterize. The liver and gallbladder are otherwise normal. Pancreas: Unremarkable. No pancreatic ductal dilatation or surrounding inflammatory changes. Spleen: Normal in size without focal abnormality. Adrenals/Urinary Tract: The adrenal glands are normal. The patient has a horseshoe kidney. There is fat stranding adjacent to both renal moieties, extending down the right pericolic gutter. No hydronephrosis on the right. Mild hydronephrosis on the left is identified with a prominent extrarenal pelvis. No ureterectasis or ureteral stones are identified. Calcifications in the lower abdomen and pelvis appear to be adjacent to rather than within the ureters. The bladder is normal. Stomach/Bowel: The stomach and small bowel are normal. A few scattered colonic diverticuli are seen without diverticulitis. The colon is poorly evaluated due to lack of distention but no obvious abnormalities are noted. The appendix is not seen but there is no secondary evidence of appendicitis. Vascular/Lymphatic: The abdominal aorta is normal in caliber. No atherosclerosis. No lymphadenopathy. Reproductive: Uterus and bilateral adnexa are unremarkable. Other: No free air or free fluid. Musculoskeletal: No acute or significant osseous findings. IMPRESSION: 1. There is fat stranding in the lower abdomen and pelvis, particularly on the right, thought to arise from the kidneys. The lack of hydronephrosis on the right in addition to the lack of renal stones would suggests this stranding is probably not due to a recently passed stone. Pyelonephritis should be considered. Recommend correlation with urinalysis and physical exam. 2. There is mild hydronephrosis on the left. No obstructing stones are seen. The finding could be due to a UPJ obstruction. The fact that the perinephric stranding  is greater on the right than the left suggests that there is not an acute obstruction on the left. Recommend clinical correlation. 3. Scattered colonic diverticuli without diverticulitis. 4. The appendix is not visualized but there is no secondary evidence of appendicitis. Electronically Signed   By: Dorise Bullion III M.D   On: 12/11/2017 16:09   US Abdomen Limited Ruq  Result Date: 12/11/2017 CLINICAL DATA:  Right upper quadrant pain EXAM: ULTRASOUND ABDOMEN LIMITED RIGHT UPPER QUADRANT COMPARISON:  None. FINDINGS: Gallbladder: No gallstones or wall thickening visualized. No sonographic Murphy sign noted by sonographer. Common bile duct: Diameter: 6 mm in caliber. Liver: Simple cyst in the left lobe measures 1.2 cm. No solid mass. Normal in echogenicity. Portal vein is patent on color Doppler imaging with normal direction of blood flow towards the liver. IMPRESSION: No acute pathology involving the liver, gallbladder, or biliary tree. Electronically Signed   By: Marybelle Killings M.D.   On: 12/11/2017 14:55    Pending Labs Unresulted Labs (From admission, onward)   Start     Ordered   12/12/17 0500  Comprehensive metabolic panel  Tomorrow morning,   R     12/11/17 1631   12/12/17 0500  CBC  Tomorrow morning,   R     12/11/17 1631   12/11/17 1653  Respiratory Panel by PCR  (Respiratory virus panel)  Once,   R     12/11/17 1652   12/11/17 1645  Lactic acid, plasma  STAT Now then every 3 hours,   STAT     12/11/17 1644   12/11/17 1645  Procalcitonin  STAT,   R     12/11/17 1644   12/11/17 1645  Protime-INR  STAT,   R     12/11/17 1644   12/11/17 1645  APTT  STAT,   R     12/11/17 1644   12/11/17 1645  Cortisol  STAT,   R     12/11/17 1644   12/11/17 1633  Culture, blood (routine x 2)  BLOOD CULTURE X 2,   R     12/11/17 1632   12/11/17 1631  Magnesium  Add-on,   R     12/11/17 1631   12/11/17 1631  Urine culture  Once,   R     12/11/17 1631   12/11/17 1631  Hemoglobin A1c  Once,   R      12/11/17 1631   12/11/17 1629  HIV antibody (Routine Testing)  Once,   R     12/11/17 1631   12/11/17 1503  Hepatitis panel, acute  STAT,   STAT     12/11/17 1502   12/11/17 1441  Urine culture  STAT,   STAT     12/11/17 1441      Vitals/Pain Today's Vitals   12/11/17 0939 12/11/17 1256 12/11/17 1357 12/11/17 1608  BP: 102/71 (!) 90/50 115/68 114/71  Pulse: (!) 107 80  74  Resp: '20 18 19 11  ' Temp: 98 F (36.7 C) 98.4 F (36.9 C)    TempSrc: Oral Oral    SpO2: 95% 98%  98%  PainSc:        Isolation Precautions Droplet precaution  Medications Medications  heparin injection 5,000 Units (has no administration in time range)  acetaminophen (TYLENOL) tablet 650 mg (has no administration in time range)    Or  acetaminophen (TYLENOL) suppository 650 mg (has no administration in time range)  ondansetron (ZOFRAN) tablet 4 mg (has no administration in time range)    Or  ondansetron (ZOFRAN) injection 4 mg (has no administration in time range)  cefTRIAXone (ROCEPHIN) 2 g in sodium chloride 0.9 % 100 mL IVPB (has no administration in time range)  0.9 % NaCl with KCl 20 mEq/ L  infusion (has no administration in time range)  guaiFENesin-dextromethorphan (ROBITUSSIN DM) 100-10 MG/5ML syrup 5 mL (has no administration in time range)  sodium chloride 0.9 % bolus 500 mL (has no administration in time range)  sodium chloride 0.9 % bolus 1,000 mL (0 mLs Intravenous Stopped 12/11/17 1631)  famotidine (PEPCID) IVPB 20 mg premix (0 mg Intravenous Stopped 12/11/17 1503)    Mobility walks

## 2017-12-11 NOTE — ED Triage Notes (Signed)
Adds that she had dry cough that hurts her head when she coughs since last Thursday.

## 2017-12-11 NOTE — ED Notes (Signed)
ED Provider at bedside. 

## 2017-12-11 NOTE — ED Notes (Signed)
Tech attempted too draw labs, unsuccessful, only have one arm too work with and the IV is in forearm, attempted to stick in hand but Kurin devices will not pull blood

## 2017-12-11 NOTE — H&P (Addendum)
[facility] History and Physical  Amanda Martinez IHK:[id number] DOB: 12-13-1965 DOA: 12/11/2017  Referring physician:   PCP: Lois Huxley, PA   Chief Complaint:  Dry cough, abdominal pain  HPI:  52 year old female with a past history of depression, breast cancer on multiple over-the-counter supplements, who presents today with gradually worsening shortness of breath, nonproductive cough, abdominal pain and headache for the last 5 days. Patient complains of pain in the right upper quadrant associated with a low-grade fever at home.she currently denies any nausea or vomiting, her diarrhea has now resolved.she states that she's been running a low-grade temperature and: Continues to have retro-orbital headache.she denies any rashes which are new in onset.She has been taking motrin on and off for 5 days along with tylenol  Patient was recently seen in the ER on 12/05/17 with a fever of 101.3, migraines, at that point in time showed a white count of 11.4, CT head was found to be negative, due to concern for meningitis, patient had a lumbar puncture done which was negative and the patient was discharged home from the ED. CSF culture was also negative. she subsequently developed nausea vomiting and diarrhea 3 days ago, which has now resolved.Her primary care provider told her that she had gastroenteritis.  ED course BP (!) 90/50 (BP Location: Left Arm)   Pulse 80   Temp 98.4 F (36.9 C) (Oral)   Resp 18   SpO2 98%  EKG shows sinus tachycardia CT abdomen pelvis shows fat stranding in the lower abdomen and pelvis particularly on the right, suggesting pyelonephritis Right upper quadrant ultrasound was negative Sodium 134, potassium 3.4, creatinine 3.38, alkaline phosphatase 299, total bilirubin 3.6      Review of Systems: negative for the following   complete review of systems was done with pertinent positives as documented in history of present illness     Past Medical History:   Diagnosis Date  . Cancer [facility])    breast   . Depression      Past Surgical History:  Procedure Laterality Date  . BREAST LUMPECTOMY    . FINGER SURGERY Right    little finger  . KNEE SURGERY Right   . SALPINGOOPHORECTOMY        Social History:  reports that she has never smoked. She has never used smokeless tobacco. She reports that she drank alcohol. Her drug history is not on file.    Allergies  Allergen Reactions  . Compazine [Prochlorperazine] Hives  . Prilosec [Omeprazole] Hives        FAMILY HISTORY  When questioned  Directly-patient reports  No family history of HTN, CVA ,DIABETES, TB, Cancer CAD, Bleeding Disorders, Sickle Cell, diabetes, anemia, asthma,   Prior to Admission medications   Medication Sig Start Date End Date Taking? Authorizing Provider  acetaminophen (TYLENOL) 500 MG tablet Take 500 mg by mouth every 6 (six) hours as needed for headache.   Yes [provider]  ALFALFA PO Take 5 tablets by mouth 2 (two) times daily.   Yes [provider]  Alpha-Lipoic Acid 200 MG CAPS Take 1 capsule by mouth 3 (three) times daily.   Yes [provider]  anastrozole (ARIMIDEX) 1 MG tablet Take 1 mg by mouth daily.  11/14/17  Yes [provider]  Ascorbic Acid (VITAMIN C PO) Take 1 tablet by mouth 3 (three) times daily. Vitamin C 2 (Sustained Release)   Yes [provider]  b complex vitamins tablet Take 3-4 tablets by mouth 2 (two)  times daily. Take 4 tables midday and Take 3 tablets in the evening.   Yes [provider]  BLACK COHOSH PO Take 1 capsule by mouth 2 (two) times daily.   Yes [provider]  CALCIUM PO Take 3 tablets by mouth 2 (two) times daily. Osteomatrix   Yes [provider]  cholecalciferol (VITAMIN D) 1000 units tablet Take 1,000-2,000 Units by mouth 3 (three) times daily. Take 1 tablet in the morning, Take 1 tablet at midday and Take 2 tablets in the evening.   Yes [provider]  citalopram (CELEXA) 40 MG tablet Take 40 mg by mouth daily.  11/20/17  Yes [provider]  EQL EVENING PRIMROSE OIL PO Take 1,300 mg by mouth 2 (two) times daily.    Yes [provider]  GARLIC PO Take 1 tablet by mouth 2 (two) times daily.   Yes [provider]  ibuprofen (ADVIL,MOTRIN) 200 MG tablet Take 200 mg by mouth every 6 (six) hours as needed for headache.   Yes [provider]  Iron-Vit C-Vit B12-Folic Acid (IRON 329 PLUS PO) Take 1-2 tablets by mouth 2 (two) times daily. Iron Plus. Take 2 tablets at midday and Take 1 tablet in the evening.   Yes [provider]  LECITHIN PO Take 3 capsules by mouth daily.   Yes [provider]  Misc Natural Products (JOINT HEALTH) CAPS Take 1 capsule by mouth 3 (three) times daily.   Yes [provider]  Multiple Vitamins-Minerals (ZINC PO) Take 1 tablet by mouth 2 (two) times daily.   Yes [provider]  Omega-3 1000 MG CAPS Take 6,000 mg by mouth daily.   Yes [provider]  OVER THE COUNTER MEDICATION Take 1 capsule by mouth 2 (two) times daily. Carotomax   Yes [provider]  OVER THE COUNTER MEDICATION Take 1 capsule by mouth 2 (two) times daily. CoQHeart   Yes [provider]  OVER THE COUNTER MEDICATION Take 1 capsule by mouth 2 (two) times daily. CorEnergy   Yes [provider]  OVER THE COUNTER MEDICATION Take 3 capsules by mouth daily. GLA   Yes [provider]  OVER THE COUNTER MEDICATION Take 2 capsules by mouth daily. Immunity Formula   Yes [provider]  OVER THE COUNTER MEDICATION Take 1 capsule by mouth daily. Mindworks   Yes [provider]  OVER THE COUNTER MEDICATION Take 1 capsule by mouth 2 (two) times daily. Optiflora   Yes [provider]  OVER THE COUNTER MEDICATION Take 1 tablet by mouth daily. Vitalized Immunity   Yes [provider]  OVER THE COUNTER  MEDICATION Take 1 capsule by mouth 2 (two) times daily. Vivix   Yes [provider]  OVER THE COUNTER MEDICATION Take 1 tablet by mouth 2 (two) times daily. Metabolic Boost   Yes [provider]  OVER THE COUNTER MEDICATION Take 2 tablets by mouth 2 (two) times daily. HerbLax   Yes [provider]  OVER THE COUNTER MEDICATION Take 2 tablets by mouth daily. Mental Activity Plus   Yes [provider]  OVER THE COUNTER MEDICATION Take 2 tablets by mouth 2 (two) times daily. Sleep Aid   Yes [provider]  OVER THE COUNTER MEDICATION Take 2 tablets by mouth 2 (two) times daily. VitaLea   Yes [provider]  OVER THE COUNTER MEDICATION Take 2 tablets by mouth every evening. DTX (Liver Detox)   Yes [provider]  OVER THE COUNTER MEDICATION Take 1 tablet by mouth every evening. Mental Activity Plus   Yes [provider]  OVER THE COUNTER MEDICATION Take 1 tablet by mouth every evening. Nutriferon   Yes [provider]  VITAMIN E PO Take 2 capsules by mouth 2 (two) times daily.   Yes [provider]  Melatonin 10 MG TABS Take 1 tablet by mouth at bedtime as needed (sleep).    [provider]  ondansetron (ZOFRAN ODT) 4 MG disintegrating tablet 4mg  ODT q4 hours prn nausea/vomit 12/05/17   Deno Etienne, DO     Physical Exam: Vitals:   12/11/17 0939 12/11/17 1256 12/11/17 1357 12/11/17 1608  BP: 102/71 (!) 90/50 115/68 114/71  Pulse: (!) 107 80  74  Resp: 20 18 19 11   Temp: 98 F (36.7 C) 98.4 F (36.9 C)    TempSrc: Oral Oral    SpO2: 95% 98%  98%        Vitals:   12/11/17 0939 12/11/17 1256 12/11/17 1357 12/11/17 1608  BP: 102/71 (!) 90/50 115/68 114/71  Pulse: (!) 107 80  74  Resp: 20 18 19 11   Temp: 98 F (36.7 C) 98.4 F (36.9 C)    TempSrc: Oral Oral    SpO2: 95% 98%  98%   Constitutional: NAD, calm, comfortable Eyes: PERRL, lids and conjunctivae normal ENMT: Mucous membranes are  moist. Posterior pharynx clear of any exudate or lesions.Normal dentition.  Neck: normal, supple, no masses, no thyromegaly Respiratory: clear to auscultation bilaterally, no wheezing, no crackles. Normal respiratory effort. No accessory muscle use.  Cardiovascular: Regular rate and rhythm, no murmurs / rubs / gallops. No extremity edema. 2+ pedal pulses. No carotid bruits.  Abdomen:   Tenderness to palpation in the right upper quadrant and epigastric area, no masses palpated. No hepatosplenomegaly. Bowel sounds positive.  Musculoskeletal: no clubbing / cyanosis. No joint deformity upper and lower extremities. Good ROM, no contractures. Normal muscle tone.  Skin: no rashes, lesions, ulcers. No induration Neurologic: CN 2-12 grossly intact. Sensation intact, DTR normal. Strength 5/5 in all 4 extremities, cerebellar testing is intact, no pronator drift, no nystagmus Psychiatric: Normal judgment and insight. Alert and oriented x 3. Normal mood.     Labs on Admission: I have personally reviewed following labs and imaging studies  CBC: Recent Labs  Lab 12/05/17 1745 12/11/17 1400  WBC 11.4* 22.1*  NEUTROABS 9.2* 19.1*  HGB 12.9 12.3  HCT 37.2 34.2*  MCV 92.8 89.8  PLT 182 160    Basic Metabolic Panel: Recent Labs  Lab 12/05/17 1745 12/11/17 1400  NA 139 134*  K 3.8 3.4*  CL 105 98*  CO2 25 23  GLUCOSE 113* 86  BUN 13 44*  CREATININE 0.86 3.38*  CALCIUM 9.0 9.5    GFR: CrCl cannot be calculated (Unknown ideal weight.).  Liver Function Tests: Recent Labs  Lab 12/11/17 1400  AST 48*  ALT 33  ALKPHOS 299*  BILITOT 3.6*  PROT 6.7  ALBUMIN 2.6*   Recent Labs  Lab 12/11/17 1400  LIPASE 31   No results for input(s): AMMONIA in the last 168 hours.  Coagulation Profile: No results for input(s): INR, PROTIME in the last 168 hours. No results for input(s): DDIMER in the last 72 hours.  Cardiac Enzymes: No results for input(s): CKTOTAL, CKMB, CKMBINDEX, TROPONINI in  the last 168 hours.  BNP (last 3 results) No results for input(s): PROBNP in the last 8760 hours.  HbA1C: No  results for input(s): HGBA1C in the last 72 hours. No results found for: HGBA1C   CBG: No results for input(s): GLUCAP in the last 168 hours.  Lipid Profile: No results for input(s): CHOL, HDL, LDLCALC, TRIG, CHOLHDL, LDLDIRECT in the last 72 hours.  Thyroid Function Tests: No results for input(s): TSH, T4TOTAL, FREET4, T3FREE, THYROIDAB in the last 72 hours.  Anemia Panel: No results for input(s): VITAMINB12, FOLATE, FERRITIN, TIBC, IRON, RETICCTPCT in the last 72 hours.  Urine analysis:    Component Value Date/Time   COLORURINE YELLOW 12/11/2017 1334   APPEARANCEUR CLOUDY (A) 12/11/2017 1334   LABSPEC 1.004 (L) 12/11/2017 1334   PHURINE 5.0 12/11/2017 1334   GLUCOSEU NEGATIVE 12/11/2017 1334   HGBUR MODERATE (A) 12/11/2017 1334   BILIRUBINUR NEGATIVE 12/11/2017 1334   KETONESUR NEGATIVE 12/11/2017 1334   PROTEINUR 30 (A) 12/11/2017 1334   NITRITE NEGATIVE 12/11/2017 1334   LEUKOCYTESUR LARGE (A) 12/11/2017 1334    Sepsis Labs: @LABRCNTIP (procalcitonin:4,lacticidven:4) ) Recent Results (from the past 240 hour(s))  CSF culture     Status: None   Collection Time: 12/05/17  7:03 PM  Result Value Ref Range Status   Specimen Description CSF  Final   Special Requests   Final    NONE Performed at [facility], [place] [address] [place], [place] [address]    Gram Stain   Final    NO WBC SEEN NO ORGANISMS SEEN Gram Stain Report Called to,Read Back By and Verified With: S DOSTER,RN @2031  12/05/17 MKELLY Performed at [facility], [place] [address], [place], [place] [address]    Culture   Final    NO GROWTH 3 DAYS Performed at [facility], [place] [address], [place], [place] [address]    Report Status 12/09/2017 FINAL  Final         Radiological Exams on Admission: Dg Chest 2 View  Result Date: 12/11/2017 CLINICAL  DATA:  Possible reaction to taking Toradol resulting and difficulty breathing and confusion. EXAM: CHEST - 2 VIEW COMPARISON:  None in PACs FINDINGS: The lungs are adequately inflated. There is no focal infiltrate. There is no pleural effusion. The heart is top-normal in size. The pulmonary vascularity is normal. The mediastinum is normal in width. The trachea is midline. The bony thorax exhibits no acute abnormality. There is widening of the right AC joint. IMPRESSION: There is no active cardiopulmonary disease. Electronically Signed   By: David  Martinique M.D.   On: 12/11/2017 14:34   Ct Renal Stone Study  Result Date: 12/11/2017 CLINICAL DATA:  Itching and fever. Headache. Abdominal distention. Elevated creatinine. EXAM: CT ABDOMEN AND PELVIS WITHOUT CONTRAST TECHNIQUE: Multidetector CT imaging of the abdomen and pelvis was performed following the standard protocol without IV contrast. COMPARISON:  None. FINDINGS: Lower chest: No acute abnormality. Hepatobiliary: Probable small cyst in the liver on series 2, image 26, too small to completely characterize. The liver and gallbladder are otherwise normal. Pancreas: Unremarkable. No pancreatic ductal dilatation or surrounding inflammatory changes. Spleen: Normal in size without focal abnormality. Adrenals/Urinary Tract: The adrenal glands are normal. The patient has a horseshoe kidney. There is fat stranding adjacent to both renal moieties, extending down the right pericolic gutter. No hydronephrosis on the right. Mild hydronephrosis on the left is identified with a prominent extrarenal pelvis. No ureterectasis or ureteral stones are identified. Calcifications in the lower abdomen and pelvis appear to be adjacent to rather than within the ureters. The bladder is normal. Stomach/Bowel: The stomach  and small bowel are normal. A few scattered colonic diverticuli are seen without diverticulitis. The colon is poorly evaluated due to lack of distention but no obvious  abnormalities are noted. The appendix is not seen but there is no secondary evidence of appendicitis. Vascular/Lymphatic: The abdominal aorta is normal in caliber. No atherosclerosis. No lymphadenopathy. Reproductive: Uterus and bilateral adnexa are unremarkable. Other: No free air or free fluid. Musculoskeletal: No acute or significant osseous findings. IMPRESSION: 1. There is fat stranding in the lower abdomen and pelvis, particularly on the right, thought to arise from the kidneys. The lack of hydronephrosis on the right in addition to the lack of renal stones would suggests this stranding is probably not due to a recently passed stone. Pyelonephritis should be considered. Recommend correlation with urinalysis and physical exam. 2. There is mild hydronephrosis on the left. No obstructing stones are seen. The finding could be due to a UPJ obstruction. The fact that the perinephric stranding is greater on the right than the left suggests that there is not an acute obstruction on the left. Recommend clinical correlation. 3. Scattered colonic diverticuli without diverticulitis. 4. The appendix is not visualized but there is no secondary evidence of appendicitis. Electronically Signed   By: Dorise Bullion III M.D   On: 12/11/2017 16:09   US Abdomen Limited Ruq  Result Date: 12/11/2017 CLINICAL DATA:  Right upper quadrant pain EXAM: ULTRASOUND ABDOMEN LIMITED RIGHT UPPER QUADRANT COMPARISON:  None. FINDINGS: Gallbladder: No gallstones or wall thickening visualized. No sonographic Murphy sign noted by sonographer. Common bile duct: Diameter: 6 mm in caliber. Liver: Simple cyst in the left lobe measures 1.2 cm. No solid mass. Normal in echogenicity. Portal vein is patent on color Doppler imaging with normal direction of blood flow towards the liver. IMPRESSION: No acute pathology involving the liver, gallbladder, or biliary tree. Electronically Signed   By: Marybelle Killings M.D.   On: 12/11/2017 14:55   Dg Chest 2  View  Result Date: 12/11/2017 CLINICAL DATA:  Possible reaction to taking Toradol resulting and difficulty breathing and confusion. EXAM: CHEST - 2 VIEW COMPARISON:  None in PACs FINDINGS: The lungs are adequately inflated. There is no focal infiltrate. There is no pleural effusion. The heart is top-normal in size. The pulmonary vascularity is normal. The mediastinum is normal in width. The trachea is midline. The bony thorax exhibits no acute abnormality. There is widening of the right AC joint. IMPRESSION: There is no active cardiopulmonary disease. Electronically Signed   By: David  Martinique M.D.   On: 12/11/2017 14:34   Ct Head Wo Contrast  Result Date: 12/05/2017 CLINICAL DATA:  Migraine headache x2 days EXAM: CT HEAD WITHOUT CONTRAST TECHNIQUE: Contiguous axial images were obtained from the base of the skull through the vertex without intravenous contrast. COMPARISON:  None. FINDINGS: Brain: No evidence of acute infarction, hemorrhage, hydrocephalus, extra-axial collection or mass lesion/mass effect. Vascular: No hyperdense vessel or unexpected calcification. Skull: Normal. Negative for fracture or focal lesion. Sinuses/Orbits: The visualized paranasal sinuses are essentially clear. The mastoid air cells are unopacified. Other: None. IMPRESSION: Normal head CT. Electronically Signed   By: Julian Hy M.D.   On: 12/05/2017 18:37   Ct Renal Stone Study  Result Date: 12/11/2017 CLINICAL DATA:  Itching and fever. Headache. Abdominal distention. Elevated creatinine. EXAM: CT ABDOMEN AND PELVIS WITHOUT CONTRAST TECHNIQUE: Multidetector CT imaging of the abdomen and pelvis was performed following the standard protocol without IV contrast. COMPARISON:  None. FINDINGS: Lower chest: No acute  abnormality. Hepatobiliary: Probable small cyst in the liver on series 2, image 26, too small to completely characterize. The liver and gallbladder are otherwise normal. Pancreas: Unremarkable. No pancreatic ductal  dilatation or surrounding inflammatory changes. Spleen: Normal in size without focal abnormality. Adrenals/Urinary Tract: The adrenal glands are normal. The patient has a horseshoe kidney. There is fat stranding adjacent to both renal moieties, extending down the right pericolic gutter. No hydronephrosis on the right. Mild hydronephrosis on the left is identified with a prominent extrarenal pelvis. No ureterectasis or ureteral stones are identified. Calcifications in the lower abdomen and pelvis appear to be adjacent to rather than within the ureters. The bladder is normal. Stomach/Bowel: The stomach and small bowel are normal. A few scattered colonic diverticuli are seen without diverticulitis. The colon is poorly evaluated due to lack of distention but no obvious abnormalities are noted. The appendix is not seen but there is no secondary evidence of appendicitis. Vascular/Lymphatic: The abdominal aorta is normal in caliber. No atherosclerosis. No lymphadenopathy. Reproductive: Uterus and bilateral adnexa are unremarkable. Other: No free air or free fluid. Musculoskeletal: No acute or significant osseous findings. IMPRESSION: 1. There is fat stranding in the lower abdomen and pelvis, particularly on the right, thought to arise from the kidneys. The lack of hydronephrosis on the right in addition to the lack of renal stones would suggests this stranding is probably not due to a recently passed stone. Pyelonephritis should be considered. Recommend correlation with urinalysis and physical exam. 2. There is mild hydronephrosis on the left. No obstructing stones are seen. The finding could be due to a UPJ obstruction. The fact that the perinephric stranding is greater on the right than the left suggests that there is not an acute obstruction on the left. Recommend clinical correlation. 3. Scattered colonic diverticuli without diverticulitis. 4. The appendix is not visualized but there is no secondary evidence of  appendicitis. Electronically Signed   By: Dorise Bullion III M.D   On: 12/11/2017 16:09   US Abdomen Limited Ruq  Result Date: 12/11/2017 CLINICAL DATA:  Right upper quadrant pain EXAM: ULTRASOUND ABDOMEN LIMITED RIGHT UPPER QUADRANT COMPARISON:  None. FINDINGS: Gallbladder: No gallstones or wall thickening visualized. No sonographic Murphy sign noted by sonographer. Common bile duct: Diameter: 6 mm in caliber. Liver: Simple cyst in the left lobe measures 1.2 cm. No solid mass. Normal in echogenicity. Portal vein is patent on color Doppler imaging with normal direction of blood flow towards the liver. IMPRESSION: No acute pathology involving the liver, gallbladder, or biliary tree. Electronically Signed   By: Marybelle Killings M.D.   On: 12/11/2017 14:55      EKG: Independently reviewed. Sinus tachycardia  Assessment/Plan Principal Problem:   Acute pyelonephritis/acute cholecystitis? Initial presentation and radiologic imaging consistent with acute pyelonephritis Given elevated bilirubin and alkaline phosphatase will order HIDA scan for tomorrow morning Patient has been started on Rocephin for possible pyelonephritis   follow urine culture, blood culture 2 Will initiate focused sepsis order set Follow HIV antibody, acute hepatitis panel Recent CSF fluid culture was negative, no suspicion for meningitis or encephalitis at this point     AKI (acute kidney injury) ([place]) Prerenal versus  secondary to use of NSAIDs as well as over-the-counter medications Will start patient on aggressive IV fluids  Follow strict I's and O's  Depression continue Celexa   Hyponatremia/hypokalemia suspect secondary to dehydration replete     DVT prophylaxis: * heparin     Code Status Orders  (From admission,  onward)        Start     Ordered   12/11/17 1630  Full code  Continuous     12/11/17 1631    Code Status History    This patient has a current code status but no historical code status.        consults called:  Family Communication: Admission, patients condition and plan of care including tests being ordered have been discussed with the patient  who indicates understanding and agree with the plan and Code Status  Admission status: inpatient ????????????????????????  Disposition plan: Further plan will depend as patient's clinical course evolves and further radiologic and laboratory data become available. Likely home when stable   At the time of admission, it appears that the appropriate admission status for this patient is INPATIENT .Thisis judged to be reasonable and necessary in order to provide the required intensity of service to ensure the patient's safetygiven thepresenting symptoms, physical exam findings, and initial radiographic and laboratory data in the context of their chronic comorbidities.   Reyne Dumas MD [facility] Pager [phone]  If 7PM-7AM, please contact night-coverage [email or web address] Password [facility]  12/11/2017, 4:32 PM

## 2017-12-12 LAB — CBC
HCT: 32.7 % — ABNORMAL LOW (ref 36.0–46.0)
Hemoglobin: 11.4 g/dL — ABNORMAL LOW (ref 12.0–15.0)
MCH: 31.3 pg (ref 26.0–34.0)
MCHC: 34.9 g/dL (ref 30.0–36.0)
MCV: 89.8 fL (ref 78.0–100.0)
PLATELETS: 311 10*3/uL (ref 150–400)
RBC: 3.64 MIL/uL — ABNORMAL LOW (ref 3.87–5.11)
RDW: 13.6 % (ref 11.5–15.5)
WBC: 19.4 10*3/uL — ABNORMAL HIGH (ref 4.0–10.5)

## 2017-12-12 LAB — COMPREHENSIVE METABOLIC PANEL
ALK PHOS: 253 U/L — AB (ref 38–126)
ALT: 33 U/L (ref 14–54)
AST: 39 U/L (ref 15–41)
Albumin: 2.2 g/dL — ABNORMAL LOW (ref 3.5–5.0)
Anion gap: 14 (ref 5–15)
BILIRUBIN TOTAL: 2 mg/dL — AB (ref 0.3–1.2)
BUN: 44 mg/dL — ABNORMAL HIGH (ref 6–20)
CALCIUM: 8.7 mg/dL — AB (ref 8.9–10.3)
CO2: 19 mmol/L — AB (ref 22–32)
CREATININE: 2.89 mg/dL — AB (ref 0.44–1.00)
Chloride: 104 mmol/L (ref 101–111)
GFR calc Af Amer: 21 mL/min — ABNORMAL LOW (ref >60)
GFR, EST NON AFRICAN AMERICAN: 18 mL/min — AB (ref >60)
Glucose, Bld: 92 mg/dL (ref 65–99)
Potassium: 3.6 mmol/L (ref 3.5–5.1)
SODIUM: 137 mmol/L (ref 135–145)
TOTAL PROTEIN: 6 g/dL — AB (ref 6.5–8.1)

## 2017-12-12 LAB — RESPIRATORY PANEL BY PCR
ADENOVIRUS-RVPPCR: NOT DETECTED
Bordetella pertussis: NOT DETECTED
CORONAVIRUS 229E-RVPPCR: NOT DETECTED
CORONAVIRUS HKU1-RVPPCR: NOT DETECTED
CORONAVIRUS NL63-RVPPCR: NOT DETECTED
CORONAVIRUS OC43-RVPPCR: NOT DETECTED
Chlamydophila pneumoniae: NOT DETECTED
Influenza A: NOT DETECTED
Influenza B: NOT DETECTED
METAPNEUMOVIRUS-RVPPCR: NOT DETECTED
Mycoplasma pneumoniae: NOT DETECTED
PARAINFLUENZA VIRUS 1-RVPPCR: NOT DETECTED
PARAINFLUENZA VIRUS 2-RVPPCR: NOT DETECTED
Parainfluenza Virus 3: NOT DETECTED
Parainfluenza Virus 4: NOT DETECTED
Respiratory Syncytial Virus: NOT DETECTED
Rhinovirus / Enterovirus: NOT DETECTED

## 2017-12-12 LAB — HEMOGLOBIN A1C
HEMOGLOBIN A1C: 5.2 % (ref 4.8–5.6)
MEAN PLASMA GLUCOSE: 102.54 mg/dL

## 2017-12-12 LAB — HEPATITIS PANEL, ACUTE
HCV Ab: <0.1 s/co ratio (ref 0.0–0.9)
Hep A IgM: NEGATIVE
Hep B C IgM: NEGATIVE
Hepatitis B Surface Ag: NEGATIVE

## 2017-12-12 LAB — HIV ANTIBODY (ROUTINE TESTING W REFLEX): HIV SCREEN 4TH GENERATION: NONREACTIVE

## 2017-12-12 LAB — CORTISOL: CORTISOL PLASMA: 18.5 ug/dL

## 2017-12-12 MED ORDER — SODIUM CHLORIDE 0.9 % IV SOLN
2.0000 g | INTRAVENOUS | Status: DC
  Administered 2017-12-12: 2 g via INTRAVENOUS
  Filled 2017-12-12: qty 2

## 2017-12-12 NOTE — Progress Notes (Signed)
Patient stated that she felt "better after the antibiotic". RN looked to see when Rocephin was due but there is no Rocephin scheduled for tonight.Last Rocephin dose was on 12/11/17. PCP was notified.

## 2017-12-12 NOTE — Progress Notes (Addendum)
PROGRESS NOTE    Amanda Martinez  MRN:[id number] DOB: 06/11/1966 DOA: 12/11/2017 PCP: Becker, Anna G, PA  Brief Narrative:  Per HPI. 52-year-old female with a past history of depression, breast cancer on multiple over-the-counter supplements, who presents today with gradually worsening shortness of breath, nonproductive cough, abdominal pain and headache for the last 5 days. Patient complains of pain in the right upper quadrant associated with a low-grade fever at home.she currently denies any nausea or vomiting, her diarrhea has now resolved.she states that she's been running a low-grade temperature and: Continues to have retro-orbital headache.she denies any rashes which are new in onset.She has been taking motrin on and off for 5 days along with tylenol   She had CT stone study with evidence of possible pyelonephritis.   Assessment & Plan:   Principal Problem:   Acute pyelonephritis Active Problems:   AKI (acute kidney injury) (HCC)   Hyponatremia   Hypokalemia   Acute pyelonephritis: UA with WBC's, bacteria, and +LE CT stone study with evidence of pyelonephritis (mild hydro on the L, but no obstructing stone seen)  Negative HIDA scan (improving bili and alk phos today) Initial presentation and radiologic imaging consistent with acute pyelonephritis Urine cx pending, Blood cx pending Follow cortisol which is pending Follow HIV antibody, acute hepatitis panel (pending) Recent CSF fluid culture was negative, no suspicion for meningitis or encephalitis at this point   AKI (acute kidney injury) (HCC) - Baseline creatinine <1, peaked to 3.38 here.  Improving today with hydration.  Likely 2/2 sepsis due to above.  Possible contribution from dehydration vs use of NSAIDs.  Also mild L sided hydro with possible UPJ obstruction.  Prerenal versus secondary to use of NSAIDs as well as over-the-counter medications. Continue IVF, trend IVF Addendum - discussed possible UPJ obstruction with urology  who recommended continuing abx treatment as noted above, follow kidney function, and outpatient follow up with urology.  Leukocytosis: 2/2 above, improving   Anemia: mild, follow   Elevated Alk phos  Elevated Bilirubin: possibly 2/2 sepsis.  Follow hepatitis panel and HIV studies.  RUQ US without acute pathology.  HIDA scan negative.  Improving.  Continue to follow and w/u additionally as appropriate.   Depression continue Celexa  Hyponatremia/hypokalemia:  Improved, follow  NAGMA:  Likely 2/2 aki and IVF with NS, continue to follow   DVT prophylaxis: heparin  Code Status: full  Family Communication: mother at bedside Disposition Plan:pending further improviement   Consultants:   none  Procedures:   none  Antimicrobials:  Anti-infectives (From admission, onward)   Start     Dose/Rate Route Frequency Ordered Stop   12/11/17 1700  cefTRIAXone (ROCEPHIN) 2 g in sodium chloride 0.9 % 100 mL IVPB     2 g 200 mL/hr over 30 Minutes Intravenous Every 24 hours 12/11/17 1631     12/11/17 1515  cefTRIAXone (ROCEPHIN) 1 g in sodium chloride 0.9 % 100 mL IVPB  Status:  Discontinued     1 g 200 mL/hr over 30 Minutes Intravenous  Once 12/11/17 1507 12/11/17 1633     Subjective: Some abdominal discomfort.   Pain in lower back. Had foul smelling urine for a few days.   No CP or SOB.   Objective: Vitals:   12/11/17 1820 12/11/17 1902 12/11/17 2252 12/12/17 0446  BP: 126/69  125/74 123/74  Pulse: 80  90 83  Resp: 16  16 (!) 22  Temp: 98.4 F (36.9 C)  99.2 F (37.3 C) 97.8 F (36.6 C)    TempSrc: Oral  Oral Oral  SpO2: 100%  96% 97%  Weight:  99.7 kg (219 lb 12.8 oz)    Height:  5' 9" (1.753 m)      Intake/Output Summary (Last 24 hours) at 12/12/2017 0838 Last data filed at 12/12/2017 0600 Gross per 24 hour  Intake 1891.67 ml  Output -  Net 1891.67 ml   Filed Weights   12/11/17 1902  Weight: 99.7 kg (219 lb 12.8 oz)    Examination:  General exam: Appears calm and  comfortable  Respiratory system: Clear to auscultation. Respiratory effort normal. Cardiovascular system: S1 & S2 heard, RRR. No JVD, murmurs, rubs, gallops or clicks. No pedal edema. Gastrointestinal system: Abdomen is nondistended, soft and mildly TTP on R side, mostly lower quadrant. No organomegaly or masses felt. Normal bowel sounds heard. No CVA tenderness, mild R sided lower back pain to palpation.  Central nervous system: Alert and oriented. No focal neurological deficits. Extremities: Symmetric 5 x 5 power. Skin: No rashes, lesions or ulcers Psychiatry: Judgement and insight appear normal. Mood & affect appropriate.     Data Reviewed: I have personally reviewed following labs and imaging studies  CBC: Recent Labs  Lab 12/05/17 1745 12/11/17 1400 12/12/17 0508  WBC 11.4* 22.1* 19.4*  NEUTROABS 9.2* 19.1*  --   HGB 12.9 12.3 11.4*  HCT 37.2 34.2* 32.7*  MCV 92.8 89.8 89.8  PLT 182 285 311   Basic Metabolic Panel: Recent Labs  Lab 12/05/17 1745 12/11/17 1400 12/11/17 2228 12/12/17 0508  NA 139 134*  --  137  K 3.8 3.4*  --  3.6  CL 105 98*  --  104  CO2 25 23  --  19*  GLUCOSE 113* 86  --  92  BUN 13 44*  --  44*  CREATININE 0.86 3.38*  --  2.89*  CALCIUM 9.0 9.5  --  8.7*  MG  --   --  2.1  --    GFR: Estimated Creatinine Clearance: 29.6 mL/min (A) (by C-G formula based on SCr of 2.89 mg/dL (H)). Liver Function Tests: Recent Labs  Lab 12/11/17 1400 12/12/17 0508  AST 48* 39  ALT 33 33  ALKPHOS 299* 253*  BILITOT 3.6* 2.0*  PROT 6.7 6.0*  ALBUMIN 2.6* 2.2*   Recent Labs  Lab 12/11/17 1400  LIPASE 31   No results for input(s): AMMONIA in the last 168 hours. Coagulation Profile: Recent Labs  Lab 12/11/17 2228  INR 1.06   Cardiac Enzymes: No results for input(s): CKTOTAL, CKMB, CKMBINDEX, TROPONINI in the last 168 hours. BNP (last 3 results) No results for input(s): PROBNP in the last 8760 hours. HbA1C: Recent Labs    12/12/17 0508    HGBA1C 5.2   CBG: No results for input(s): GLUCAP in the last 168 hours. Lipid Profile: No results for input(s): CHOL, HDL, LDLCALC, TRIG, CHOLHDL, LDLDIRECT in the last 72 hours. Thyroid Function Tests: No results for input(s): TSH, T4TOTAL, FREET4, T3FREE, THYROIDAB in the last 72 hours. Anemia Panel: No results for input(s): VITAMINB12, FOLATE, FERRITIN, TIBC, IRON, RETICCTPCT in the last 72 hours. Sepsis Labs: Recent Labs  Lab 12/11/17 1905 12/11/17 2228  PROCALCITON  --  5.52  LATICACIDVEN 1.4  --     Recent Results (from the past 240 hour(s))  CSF culture     Status: None   Collection Time: 12/05/17  7:03 PM  Result Value Ref Range Status   Specimen Description CSF  Final   Special Requests     Final    NONE Performed at [facility], [place] [address], [place], [place] [address]    Gram Stain   Final    NO WBC SEEN NO ORGANISMS SEEN Gram Stain Report Called to,Read Back By and Verified With: S DOSTER,RN _0  12/05/17 MKELLY Performed at [facility], [place] [address] [place], [place] [address]    Culture   Final    NO GROWTH 3 DAYS Performed at [facility], [place] [address] [place], [place] [address]    Report Status 12/09/2017 FINAL  Final  Respiratory Panel by PCR     Status: None   Collection Time: 12/11/17  4:53 PM  Result Value Ref Range Status   Adenovirus NOT DETECTED NOT DETECTED Final   Coronavirus 229E NOT DETECTED NOT DETECTED Final   Coronavirus HKU1 NOT DETECTED NOT DETECTED Final   Coronavirus NL63 NOT DETECTED NOT DETECTED Final   Coronavirus OC43 NOT DETECTED NOT DETECTED Final   Metapneumovirus NOT DETECTED NOT DETECTED Final   Rhinovirus / Enterovirus NOT DETECTED NOT DETECTED Final   Influenza A NOT DETECTED NOT DETECTED Final   Influenza B NOT DETECTED NOT DETECTED Final   Parainfluenza Virus 1 NOT DETECTED NOT DETECTED Final   Parainfluenza Virus 2 NOT DETECTED NOT DETECTED Final   Parainfluenza  Virus 3 NOT DETECTED NOT DETECTED Final   Parainfluenza Virus 4 NOT DETECTED NOT DETECTED Final   Respiratory Syncytial Virus NOT DETECTED NOT DETECTED Final   Bordetella pertussis NOT DETECTED NOT DETECTED Final   Chlamydophila pneumoniae NOT DETECTED NOT DETECTED Final   Mycoplasma pneumoniae NOT DETECTED NOT DETECTED Final         Radiology Studies: Dg Chest 2 View  Result Date: 12/11/2017 CLINICAL DATA:  Possible reaction to taking Toradol resulting and difficulty breathing and confusion. EXAM: CHEST - 2 VIEW COMPARISON:  None in PACs FINDINGS: The lungs are adequately inflated. There is no focal infiltrate. There is no pleural effusion. The heart is top-normal in size. The pulmonary vascularity is normal. The mediastinum is normal in width. The trachea is midline. The bony thorax exhibits no acute abnormality. There is widening of the right AC joint. IMPRESSION: There is no active cardiopulmonary disease. Electronically Signed   By: David  Martinique M.D.   On: 12/11/2017 14:34   Nm Hepatobiliary Liver Func  Result Date: 12/11/2017 CLINICAL DATA:  Right upper quadrant abdominal pain. Low-grade fever. EXAM: NUCLEAR MEDICINE HEPATOBILIARY IMAGING TECHNIQUE: Sequential images of the abdomen were obtained out to 60 minutes following intravenous administration of radiopharmaceutical. RADIOPHARMACEUTICALS:  7.83 mCi Tc-1m Choletec IV COMPARISON:  CT abdomen pelvis of the same day. Right upper quadrant ultrasound of the same day. FINDINGS: Prompt uptake and biliary excretion of activity by the liver is seen. Gallbladder was not initially seen. Following administration of 3.0 mg morphine 2 hours after the initial injection, the gallbladder was visualized. Gallbladder activity is visualized, consistent with patency of cystic duct. Biliary activity passes into small bowel, consistent with patent common bile duct. IMPRESSION: Gallbladder opacification following administration of morphine without evidence  for acute or chronic cholecystitis. Electronically Signed   By: CSan MorelleM.D.   On: 12/11/2017 22:20   Ct Renal Stone Study  Result Date: 12/11/2017 CLINICAL DATA:  Itching and fever. Headache. Abdominal distention. Elevated creatinine. EXAM: CT ABDOMEN AND PELVIS WITHOUT CONTRAST TECHNIQUE: Multidetector CT imaging of the abdomen and pelvis was performed following the standard protocol without IV contrast. COMPARISON:  None. FINDINGS: Lower  chest: No acute abnormality. Hepatobiliary: Probable small cyst in the liver on series 2, image 26, too small to completely characterize. The liver and gallbladder are otherwise normal. Pancreas: Unremarkable. No pancreatic ductal dilatation or surrounding inflammatory changes. Spleen: Normal in size without focal abnormality. Adrenals/Urinary Tract: The adrenal glands are normal. The patient has a horseshoe kidney. There is fat stranding adjacent to both renal moieties, extending down the right pericolic gutter. No hydronephrosis on the right. Mild hydronephrosis on the left is identified with a prominent extrarenal pelvis. No ureterectasis or ureteral stones are identified. Calcifications in the lower abdomen and pelvis appear to be adjacent to rather than within the ureters. The bladder is normal. Stomach/Bowel: The stomach and small bowel are normal. A few scattered colonic diverticuli are seen without diverticulitis. The colon is poorly evaluated due to lack of distention but no obvious abnormalities are noted. The appendix is not seen but there is no secondary evidence of appendicitis. Vascular/Lymphatic: The abdominal aorta is normal in caliber. No atherosclerosis. No lymphadenopathy. Reproductive: Uterus and bilateral adnexa are unremarkable. Other: No free air or free fluid. Musculoskeletal: No acute or significant osseous findings. IMPRESSION: 1. There is fat stranding in the lower abdomen and pelvis, particularly on the right, thought to arise from the  kidneys. The lack of hydronephrosis on the right in addition to the lack of renal stones would suggests this stranding is probably not due to a recently passed stone. Pyelonephritis should be considered. Recommend correlation with urinalysis and physical exam. 2. There is mild hydronephrosis on the left. No obstructing stones are seen. The finding could be due to a UPJ obstruction. The fact that the perinephric stranding is greater on the right than the left suggests that there is not an acute obstruction on the left. Recommend clinical correlation. 3. Scattered colonic diverticuli without diverticulitis. 4. The appendix is not visualized but there is no secondary evidence of appendicitis. Electronically Signed   By: David  Williams III M.D   On: 12/11/2017 16:09   Us Abdomen Limited Ruq  Result Date: 12/11/2017 CLINICAL DATA:  Right upper quadrant pain EXAM: ULTRASOUND ABDOMEN LIMITED RIGHT UPPER QUADRANT COMPARISON:  None. FINDINGS: Gallbladder: No gallstones or wall thickening visualized. No sonographic Murphy sign noted by sonographer. Common bile duct: Diameter: 6 mm in caliber. Liver: Simple cyst in the left lobe measures 1.2 cm. No solid mass. Normal in echogenicity. Portal vein is patent on color Doppler imaging with normal direction of blood flow towards the liver. IMPRESSION: No acute pathology involving the liver, gallbladder, or biliary tree. Electronically Signed   By: Arthur  Hoss M.D.   On: 12/11/2017 14:55        Scheduled Meds: . citalopram  40 mg Oral Daily  . feeding supplement  1 Container Oral TID BM  . heparin  5,000 Units Subcutaneous Q8H   Continuous Infusions: . 0.9 % NaCl with KCl 20 mEq / L 100 mL/hr at 12/12/17 0834  . cefTRIAXone (ROCEPHIN)  IV Stopped (12/11/17 2351)     LOS: 1 day    Time spent: over 30 min    Caldwell Powell, MD [facility] Pager [phone]  If 7PM-7AM, please contact night-coverage [email or web address] Password [email or web address] 12/12/2017, 8:38  AM   

## 2017-12-13 DIAGNOSIS — E871 Hypo-osmolality and hyponatremia: Secondary | ICD-10-CM

## 2017-12-13 DIAGNOSIS — E876 Hypokalemia: Secondary | ICD-10-CM

## 2017-12-13 LAB — COMPREHENSIVE METABOLIC PANEL
ALT: 71 U/L — ABNORMAL HIGH (ref 14–54)
AST: 94 U/L — AB (ref 15–41)
Albumin: 1.9 g/dL — ABNORMAL LOW (ref 3.5–5.0)
Alkaline Phosphatase: 257 U/L — ABNORMAL HIGH (ref 38–126)
Anion gap: 11 (ref 5–15)
BILIRUBIN TOTAL: 1.1 mg/dL (ref 0.3–1.2)
BUN: 39 mg/dL — AB (ref 6–20)
CO2: 20 mmol/L — ABNORMAL LOW (ref 22–32)
Calcium: 8.3 mg/dL — ABNORMAL LOW (ref 8.9–10.3)
Chloride: 110 mmol/L (ref 101–111)
Creatinine, Ser: 2.18 mg/dL — ABNORMAL HIGH (ref 0.44–1.00)
GFR calc Af Amer: 29 mL/min — ABNORMAL LOW (ref >60)
GFR, EST NON AFRICAN AMERICAN: 25 mL/min — AB (ref >60)
Glucose, Bld: 108 mg/dL — ABNORMAL HIGH (ref 65–99)
POTASSIUM: 3.9 mmol/L (ref 3.5–5.1)
Sodium: 141 mmol/L (ref 135–145)
TOTAL PROTEIN: 5.4 g/dL — AB (ref 6.5–8.1)

## 2017-12-13 LAB — CBC
HEMATOCRIT: 30 % — AB (ref 36.0–46.0)
Hemoglobin: 10.5 g/dL — ABNORMAL LOW (ref 12.0–15.0)
MCH: 31.2 pg (ref 26.0–34.0)
MCHC: 35 g/dL (ref 30.0–36.0)
MCV: 89 fL (ref 78.0–100.0)
Platelets: 313 10*3/uL (ref 150–400)
RBC: 3.37 MIL/uL — ABNORMAL LOW (ref 3.87–5.11)
RDW: 13.9 % (ref 11.5–15.5)
WBC: 13.1 10*3/uL — AB (ref 4.0–10.5)

## 2017-12-13 LAB — MAGNESIUM: MAGNESIUM: 2 mg/dL (ref 1.7–2.4)

## 2017-12-13 MED ORDER — SODIUM CHLORIDE 0.9 % IV SOLN
1.0000 g | INTRAVENOUS | Status: DC
  Administered 2017-12-13: 1 g via INTRAVENOUS
  Filled 2017-12-13: qty 1

## 2017-12-13 NOTE — Progress Notes (Signed)
Patient ID: Amanda Martinez, female   DOB: 1966/06/09, 52 y.o.   MRN: [id number]  PROGRESS NOTE    Amanda Martinez  WGN:[id number] DOB: 1965/11/25 DOA: 12/11/2017 PCP: Lois Huxley, PA   Brief Narrative:  52 year old female with history of depression, breast cancer presented on 12/11/2017 with abdominal pain and dry cough.  She was found to have probable pyelonephritis on CAT scan of the abdomen and pelvis with acute kidney injury.  She was started on IV antibiotics and IV fluids.  Assessment & Plan:   Principal Problem:   Acute pyelonephritis Active Problems:   AKI (acute kidney injury) ([place])   Hyponatremia   Hypokalemia   Acute pyelonephritis -Continue Rocephin.  Cultures negative so far.  Continue pain management and IV fluids -CT scan of the abdomen showed evidence of pyelonephritis with mild hydronephrosis on the left but no obstructing stone seen: Case was discussed by prior hospitalist with urologist on call on 12/12/2017 who recommended outpatient follow-up with urology  Acute kidney injury -Probably secondary to above. -Improving.  Continue IV fluids.  Repeat a.m. labs  Leukocytosis -Improving.  Repeat a.m. Labs.  Elevated LFTs -Questionable cause.  Improving.  Right upper quadrant ultrasound and HIDA scan was negative.  Might need outpatient GI evaluation and follow-up. -Hepatitis panel negative.  Depression -Continue Celexa  Hypokalemia/hyponatremia -Improved  DVT prophylaxis: Heparin Code Status: Full Family Communication: Mother at bedside Disposition Plan: Probable discharge in 1 to 2 days  Consultants: None  Procedures: None  Antimicrobials: Rocephin from 12/11/2017 onwards   Subjective: Patient seen and examined at bedside.  She denies any overnight fever, nausea or vomiting.  Her appetite is still poor and she still feels weak.  Complains of intermittent abdominal pain.  Objective: Vitals:   12/12/17 1318 12/12/17 2018 12/13/17 0459 12/13/17 0800  BP:  115/75 117/64 121/65 126/77  Pulse: 83 75 69 75  Resp: 18 18 18 18   Temp: 98.2 F (36.8 C) 99.3 F (37.4 C) 98.7 F (37.1 C) 98.1 F (36.7 C)  TempSrc: Oral Oral Oral Oral  SpO2: 99% 99% 96% 98%  Weight:      Height:        Intake/Output Summary (Last 24 hours) at 12/13/2017 1137 Last data filed at 12/13/2017 0944 Gross per 24 hour  Intake 3600 ml  Output -  Net 3600 ml   Filed Weights   12/11/17 1902  Weight: 99.7 kg (219 lb 12.8 oz)    Examination:  General exam: Appears calm and comfortable  Respiratory system: Bilateral decreased breath sound at bases Cardiovascular system: S1 & S2 heard, rate controlled  gastrointestinal system: Abdomen is nondistended, soft and mildly tender in the lower quadrant. Normal bowel sounds heard. Extremities: No cyanosis, clubbing, edema      Data Reviewed: I have personally reviewed following labs and imaging studies  CBC: Recent Labs  Lab 12/11/17 1400 12/12/17 0508 12/13/17 0419  WBC 22.1* 19.4* 13.1*  NEUTROABS 19.1*  --   --   HGB 12.3 11.4* 10.5*  HCT 34.2* 32.7* 30.0*  MCV 89.8 89.8 89.0  PLT 285 311 784   Basic Metabolic Panel: Recent Labs  Lab 12/11/17 1400 12/11/17 2228 12/12/17 0508 12/13/17 0419  NA 134*  --  137 141  K 3.4*  --  3.6 3.9  CL 98*  --  104 110  CO2 23  --  19* 20*  GLUCOSE 86  --  92 108*  BUN 44*  --  44* 39*  CREATININE 3.38*  --  2.89* 2.18*  CALCIUM 9.5  --  8.7* 8.3*  MG  --  2.1  --  2.0   GFR: Estimated Creatinine Clearance: 39.2 mL/min (A) (by C-G formula based on SCr of 2.18 mg/dL (H)). Liver Function Tests: Recent Labs  Lab 12/11/17 1400 12/12/17 0508 12/13/17 0419  AST 48* 39 94*  ALT 33 33 71*  ALKPHOS 299* 253* 257*  BILITOT 3.6* 2.0* 1.1  PROT 6.7 6.0* 5.4*  ALBUMIN 2.6* 2.2* 1.9*   Recent Labs  Lab 12/11/17 1400  LIPASE 31   No results for input(s): AMMONIA in the last 168 hours. Coagulation Profile: Recent Labs  Lab 12/11/17 2228  INR 1.06   Cardiac  Enzymes: No results for input(s): CKTOTAL, CKMB, CKMBINDEX, TROPONINI in the last 168 hours. BNP (last 3 results) No results for input(s): PROBNP in the last 8760 hours. HbA1C: Recent Labs    12/12/17 0508  HGBA1C 5.2   CBG: No results for input(s): GLUCAP in the last 168 hours. Lipid Profile: No results for input(s): CHOL, HDL, LDLCALC, TRIG, CHOLHDL, LDLDIRECT in the last 72 hours. Thyroid Function Tests: No results for input(s): TSH, T4TOTAL, FREET4, T3FREE, THYROIDAB in the last 72 hours. Anemia Panel: No results for input(s): VITAMINB12, FOLATE, FERRITIN, TIBC, IRON, RETICCTPCT in the last 72 hours. Sepsis Labs: Recent Labs  Lab 12/11/17 1905 12/11/17 2228  PROCALCITON  --  5.52  LATICACIDVEN 1.4  --     Recent Results (from the past 240 hour(s))  CSF culture     Status: None   Collection Time: 12/05/17  7:03 PM  Result Value Ref Range Status   Specimen Description CSF  Final   Special Requests   Final    NONE Performed at [facility], [place] [address], [place], [place] [address]    Gram Stain   Final    NO WBC SEEN NO ORGANISMS SEEN Gram Stain Report Called to,Read Back By and Verified With: S DOSTER,RN @2031  12/05/17 MKELLY Performed at [facility], [place] [address] [place], [place] [address]    Culture   Final    NO GROWTH 3 DAYS Performed at [facility], [place] [address] [place], [place] [address]    Report Status 12/09/2017 FINAL  Final  Urine culture     Status: Abnormal (Preliminary result)   Collection Time: 12/11/17  1:34 PM  Result Value Ref Range Status   Specimen Description   Final    URINE, CLEAN CATCH Performed at [facility], [place] [address], [place], [place] [address]    Special Requests   Final    NONE Performed at [facility], [place] [address], [place], [place] [address]    Culture >=100,000 COLONIES/mL ESCHERICHIA COLI (A)  Final   Report Status PENDING   Incomplete  Respiratory Panel by PCR     Status: None   Collection Time: 12/11/17  4:53 PM  Result Value Ref Range Status   Adenovirus NOT DETECTED NOT DETECTED Final   Coronavirus 229E NOT DETECTED NOT DETECTED Final   Coronavirus HKU1 NOT DETECTED NOT DETECTED Final   Coronavirus NL63 NOT DETECTED NOT DETECTED Final   Coronavirus OC43 NOT DETECTED NOT DETECTED Final   Metapneumovirus NOT DETECTED NOT DETECTED Final   Rhinovirus / Enterovirus NOT DETECTED NOT DETECTED Final   Influenza A NOT DETECTED NOT DETECTED Final   Influenza B NOT DETECTED NOT DETECTED Final   Parainfluenza Virus 1 NOT DETECTED NOT DETECTED Final  Parainfluenza Virus 2 NOT DETECTED NOT DETECTED Final   Parainfluenza Virus 3 NOT DETECTED NOT DETECTED Final   Parainfluenza Virus 4 NOT DETECTED NOT DETECTED Final   Respiratory Syncytial Virus NOT DETECTED NOT DETECTED Final   Bordetella pertussis NOT DETECTED NOT DETECTED Final   Chlamydophila pneumoniae NOT DETECTED NOT DETECTED Final   Mycoplasma pneumoniae NOT DETECTED NOT DETECTED Final  Culture, blood (routine x 2)     Status: None (Preliminary result)   Collection Time: 12/11/17  6:41 PM  Result Value Ref Range Status   Specimen Description   Final    BLOOD LEFT HAND Performed at [facility], [place] [address], [place], [place] [address]    Special Requests   Final    BOTTLES DRAWN AEROBIC ONLY Blood Culture adequate volume Performed at [place] [address] [place], [place] [address]    Culture   Final    NO GROWTH < 12 HOURS Performed at [place] [address] [place], [place] [address]    Report Status PENDING  Incomplete  Culture, blood (routine x 2)     Status: None (Preliminary result)   Collection Time: 12/11/17  7:00 PM  Result Value Ref Range Status   Specimen Description   Final    BLOOD LEFT HAND Performed at [place] [address], [place], [place]  [address]    Special Requests   Final    BOTTLES DRAWN AEROBIC ONLY Blood Culture adequate volume Performed at [place] [address], [place],  [address]    Culture   Final    NO GROWTH < 12 HOURS Performed at [place] [address] [place],  [address]    Report Status PENDING  Incomplete         Radiology Studies: Dg Chest 2 View  Result Date: 12/11/2017 CLINICAL DATA:  Possible reaction to taking Toradol resulting and difficulty breathing and confusion. EXAM: CHEST - 2 VIEW COMPARISON:  None in PACs FINDINGS: The lungs are adequately inflated. There is no focal infiltrate. There is no pleural effusion. The heart is top-normal in size. The pulmonary vascularity is normal. The mediastinum is normal in width. The trachea is midline. The bony thorax exhibits no acute abnormality. There is widening of the right AC joint. IMPRESSION: There is no active cardiopulmonary disease. Electronically Signed   By: David  Martinique M.D.   On: 12/11/2017 14:34   Nm Hepatobiliary Liver Func  Result Date: 12/11/2017 CLINICAL DATA:  Right upper quadrant abdominal pain. Low-grade fever. EXAM: NUCLEAR MEDICINE HEPATOBILIARY IMAGING TECHNIQUE: Sequential images of the abdomen were obtained out to 60 minutes following intravenous administration of radiopharmaceutical. RADIOPHARMACEUTICALS:  7.83 mCi Tc-75m  Choletec IV COMPARISON:  CT abdomen pelvis of the same day. Right upper quadrant ultrasound of the same day. FINDINGS: Prompt uptake and biliary excretion of activity by the liver is seen. Gallbladder was not initially seen. Following administration of 3.0 mg morphine 2 hours after the initial injection, the gallbladder was visualized. Gallbladder activity is visualized, consistent with patency of cystic duct. Biliary activity passes into small bowel, consistent with patent common bile duct. IMPRESSION: Gallbladder opacification following administration of morphine  without evidence for acute or chronic cholecystitis. Electronically Signed   By: San Morelle M.D.   On: 12/11/2017 22:20   Ct Renal Stone Study  Result Date: 12/11/2017 CLINICAL DATA:  Itching and fever. Headache. Abdominal distention. Elevated creatinine. EXAM: CT  ABDOMEN AND PELVIS WITHOUT CONTRAST TECHNIQUE: Multidetector CT imaging of the abdomen and pelvis was performed following the standard protocol without IV contrast. COMPARISON:  None. FINDINGS: Lower chest: No acute abnormality. Hepatobiliary: Probable small cyst in the liver on series 2, image 26, too small to completely characterize. The liver and gallbladder are otherwise normal. Pancreas: Unremarkable. No pancreatic ductal dilatation or surrounding inflammatory changes. Spleen: Normal in size without focal abnormality. Adrenals/Urinary Tract: The adrenal glands are normal. The patient has a horseshoe kidney. There is fat stranding adjacent to both renal moieties, extending down the right pericolic gutter. No hydronephrosis on the right. Mild hydronephrosis on the left is identified with a prominent extrarenal pelvis. No ureterectasis or ureteral stones are identified. Calcifications in the lower abdomen and pelvis appear to be adjacent to rather than within the ureters. The bladder is normal. Stomach/Bowel: The stomach and small bowel are normal. A few scattered colonic diverticuli are seen without diverticulitis. The colon is poorly evaluated due to lack of distention but no obvious abnormalities are noted. The appendix is not seen but there is no secondary evidence of appendicitis. Vascular/Lymphatic: The abdominal aorta is normal in caliber. No atherosclerosis. No lymphadenopathy. Reproductive: Uterus and bilateral adnexa are unremarkable. Other: No free air or free fluid. Musculoskeletal: No acute or significant osseous findings. IMPRESSION: 1. There is fat stranding in the lower abdomen and pelvis, particularly on the right, thought  to arise from the kidneys. The lack of hydronephrosis on the right in addition to the lack of renal stones would suggests this stranding is probably not due to a recently passed stone. Pyelonephritis should be considered. Recommend correlation with urinalysis and physical exam. 2. There is mild hydronephrosis on the left. No obstructing stones are seen. The finding could be due to a UPJ obstruction. The fact that the perinephric stranding is greater on the right than the left suggests that there is not an acute obstruction on the left. Recommend clinical correlation. 3. Scattered colonic diverticuli without diverticulitis. 4. The appendix is not visualized but there is no secondary evidence of appendicitis. Electronically Signed   By: Dorise Bullion III M.D   On: 12/11/2017 16:09   US Abdomen Limited Ruq  Result Date: 12/11/2017 CLINICAL DATA:  Right upper quadrant pain EXAM: ULTRASOUND ABDOMEN LIMITED RIGHT UPPER QUADRANT COMPARISON:  None. FINDINGS: Gallbladder: No gallstones or wall thickening visualized. No sonographic Murphy sign noted by sonographer. Common bile duct: Diameter: 6 mm in caliber. Liver: Simple cyst in the left lobe measures 1.2 cm. No solid mass. Normal in echogenicity. Portal vein is patent on color Doppler imaging with normal direction of blood flow towards the liver. IMPRESSION: No acute pathology involving the liver, gallbladder, or biliary tree. Electronically Signed   By: Marybelle Killings M.D.   On: 12/11/2017 14:55        Scheduled Meds: . citalopram  40 mg Oral Daily  . feeding supplement  1 Container Oral TID BM  . heparin  5,000 Units Subcutaneous Q8H   Continuous Infusions: . 0.9 % NaCl with KCl 20 mEq / L 100 mL/hr at 12/13/17 0439  . cefTRIAXone (ROCEPHIN)  IV       LOS: 2 days        Aline August, MD [facility] Pager [phone]  If 7PM-7AM, please contact night-coverage [email or web address] Password [facility] 12/13/2017, 11:37 AM

## 2017-12-14 DIAGNOSIS — A419 Sepsis, unspecified organism: Secondary | ICD-10-CM

## 2017-12-14 LAB — CBC WITH DIFFERENTIAL/PLATELET
Basophils Absolute: 0 10*3/uL (ref 0.0–0.1)
Basophils Relative: 0 %
EOS ABS: 0.2 10*3/uL (ref 0.0–0.7)
Eosinophils Relative: 2 %
HEMATOCRIT: 30.4 % — AB (ref 36.0–46.0)
Hemoglobin: 10.8 g/dL — ABNORMAL LOW (ref 12.0–15.0)
LYMPHS ABS: 2 10*3/uL (ref 0.7–4.0)
Lymphocytes Relative: 17 %
MCH: 31.6 pg (ref 26.0–34.0)
MCHC: 35.5 g/dL (ref 30.0–36.0)
MCV: 88.9 fL (ref 78.0–100.0)
MONO ABS: 1.1 10*3/uL — AB (ref 0.1–1.0)
MONOS PCT: 9 %
NEUTROS ABS: 8.4 10*3/uL — AB (ref 1.7–7.7)
Neutrophils Relative %: 72 %
Platelets: 373 10*3/uL (ref 150–400)
RBC: 3.42 MIL/uL — ABNORMAL LOW (ref 3.87–5.11)
RDW: 14.3 % (ref 11.5–15.5)
WBC: 11.7 10*3/uL — AB (ref 4.0–10.5)

## 2017-12-14 LAB — BASIC METABOLIC PANEL
Anion gap: 12 (ref 5–15)
BUN: 27 mg/dL — AB (ref 6–20)
CHLORIDE: 111 mmol/L (ref 101–111)
CO2: 19 mmol/L — ABNORMAL LOW (ref 22–32)
Calcium: 8.4 mg/dL — ABNORMAL LOW (ref 8.9–10.3)
Creatinine, Ser: 1.52 mg/dL — ABNORMAL HIGH (ref 0.44–1.00)
GFR calc Af Amer: 45 mL/min — ABNORMAL LOW (ref >60)
GFR calc non Af Amer: 39 mL/min — ABNORMAL LOW (ref >60)
Glucose, Bld: 100 mg/dL — ABNORMAL HIGH (ref 65–99)
POTASSIUM: 4.4 mmol/L (ref 3.5–5.1)
SODIUM: 142 mmol/L (ref 135–145)

## 2017-12-14 LAB — URINE CULTURE: Culture: >=100000 — AB

## 2017-12-14 LAB — MAGNESIUM: MAGNESIUM: 1.7 mg/dL (ref 1.7–2.4)

## 2017-12-14 MED ORDER — CEPHALEXIN 500 MG PO CAPS: 500.0000 mg | ORAL_CAPSULE | Freq: Two times a day (BID) | ORAL | 0 refills | Status: AC

## 2017-12-14 MED ORDER — CEPHALEXIN 500 MG PO CAPS: 500.0000 mg | ORAL_CAPSULE | Freq: Two times a day (BID) | ORAL | Status: DC

## 2017-12-14 MED ORDER — CEPHALEXIN 500 MG PO CAPS: 500.0000 mg | ORAL_CAPSULE | Freq: Three times a day (TID) | ORAL | 0 refills | Status: DC

## 2017-12-14 NOTE — Discharge Summary (Addendum)
Physician Discharge Summary  Amanda Martinez HUD:[id number] DOB: [birth year] DOA: 12/11/2017  PCP: Lois Huxley, PA  Admit date: 12/11/2017 Discharge date: 12/14/2017  Admitted From: Home Disposition: Home  Recommendations for Outpatient Follow-up:  1. Follow up with PCP in 1 week with repeat CBC/BMP 2. Follow-up with urology as an outpatient   Home Health: No Equipment/Devices: None  Discharge Condition: Stable CODE STATUS: Full Diet recommendation: Regular   Brief/Interim Summary: 52 year old female with history of depression, breast cancer presented on 12/11/2017 with abdominal pain and dry cough.  She was found to have probable pyelonephritis on CAT scan of the abdomen and pelvis with acute kidney injury.  She was started on IV antibiotics and IV fluids.  During the hospitalization, her overall condition improved.  Urine cultures grew E. coli.  She will be discharged home on oral Keflex with outpatient follow-up with urology.   Discharge Diagnoses:  Principal Problem:   Acute pyelonephritis Active Problems:   AKI (acute kidney injury) ([place])   Hyponatremia   Hypokalemia  Acute pyelonephritis -Currently on Rocephin.    Urine culture grew E. coli pansensitive.    Will discharge on oral Keflex for 7 more days.  Patient is hemodynamically stable, tolerating diet and has felt much improved.  -CT scan of the abdomen showed evidence of pyelonephritis with mild hydronephrosis on the left but no obstructing stone seen: Case was discussed by prior hospitalist with urologist on call on 12/12/2017 who recommended outpatient follow-up with urology  Acute kidney injury -Probably secondary to above. -Much improved with IV fluids.  Outpatient follow-up  Leukocytosis -Improving.    Outpatient follow-up.  Elevated LFTs -Questionable cause.  Improving.  Right upper quadrant ultrasound and HIDA scan was negative.  Outpatient follow-up. Might need outpatient GI evaluation and follow-up. -Hepatitis  panel negative.  Depression -Continue Celexa  Hypokalemia/hyponatremia -Improved     Discharge Instructions  Discharge Instructions    Ambulatory referral to Urology   Complete by:  As directed    Pyelonephritis   Call MD for:  difficulty breathing, headache or visual disturbances   Complete by:  As directed    Call MD for:  extreme fatigue   Complete by:  As directed    Call MD for:  hives   Complete by:  As directed    Call MD for:  persistant dizziness or light-headedness   Complete by:  As directed    Call MD for:  persistant nausea and vomiting   Complete by:  As directed    Call MD for:  severe uncontrolled pain   Complete by:  As directed    Call MD for:  temperature >100.4   Complete by:  As directed    Diet general   Complete by:  As directed    Increase activity slowly   Complete by:  As directed      Allergies as of 12/14/2017      Reactions   Compazine [prochlorperazine] Hives   Prilosec [omeprazole] Hives      Medication List    STOP taking these medications   OVER THE COUNTER MEDICATION   OVER THE COUNTER MEDICATION   OVER THE COUNTER MEDICATION   OVER THE COUNTER MEDICATION   OVER THE COUNTER MEDICATION   OVER THE COUNTER MEDICATION   OVER THE COUNTER MEDICATION   OVER THE COUNTER MEDICATION   OVER THE COUNTER MEDICATION   OVER THE [place]  OVER THE COUNTER MEDICATION   OVER THE COUNTER MEDICATION   OVER THE COUNTER MEDICATION   OVER THE COUNTER MEDICATION     TAKE these medications   acetaminophen 500 MG tablet Commonly known as:  TYLENOL Take 500 mg by mouth every 6 (six) hours as needed for headache.   ALFALFA PO Take 5 tablets by mouth 2 (two) times daily.   Alpha-Lipoic Acid 200 MG Caps Take 1 capsule by mouth 3 (three) times daily.   anastrozole 1 MG tablet Commonly known as:  ARIMIDEX Take 1 mg by mouth daily.   b complex  vitamins tablet Take 3-4 tablets by mouth 2 (two) times daily. Take 4 tables midday and Take 3 tablets in the evening.   BLACK COHOSH PO Take 1 capsule by mouth 2 (two) times daily.   CALCIUM PO Take 3 tablets by mouth 2 (two) times daily. Osteomatrix   cephALEXin 500 MG capsule Commonly known as:  KEFLEX Take 1 capsule (500 mg total) by mouth 2 (two) times daily for 7 days.   cholecalciferol 1000 units tablet Commonly known as:  VITAMIN D Take 1,000-2,000 Units by mouth 3 (three) times daily. Take 1 tablet in the morning, Take 1 tablet at midday and Take 2 tablets in the evening.   citalopram 40 MG tablet Commonly known as:  CELEXA Take 40 mg by mouth daily.   EQL EVENING PRIMROSE OIL PO Take 1,300 mg by mouth 2 (two) times daily.   GARLIC PO Take 1 tablet by mouth 2 (two) times daily.   ibuprofen 200 MG tablet Commonly known as:  ADVIL,MOTRIN Take 200 mg by mouth every 6 (six) hours as needed for headache.   IRON 100 PLUS PO Take 1-2 tablets by mouth 2 (two) times daily. Iron Plus. Take 2 tablets at midday and Take 1 tablet in the evening.   JOINT HEALTH Caps Take 1 capsule by mouth 3 (three) times daily.   LECITHIN PO Take 3 capsules by mouth daily.   Melatonin 10 MG Tabs Take 1 tablet by mouth at bedtime as needed (sleep).   Omega-3 1000 MG Caps Take 6,000 mg by mouth daily.   ondansetron 4 MG disintegrating tablet Commonly known as:  ZOFRAN ODT 4mg  ODT q4 hours prn nausea/vomit   OVER THE COUNTER MEDICATION Take 1 capsule by mouth 2 (two) times daily. CoQHeart   VITAMIN C PO Take 1 tablet by mouth 3 (three) times daily. Vitamin C 2 (Sustained Release)   VITAMIN E PO Take 2 capsules by mouth 2 (two) times daily.   ZINC PO Take 1 tablet by mouth 2 (two) times daily.       Follow-up Information    [facility] Follow up.   Why:  Call for follow up appointment Contact information: [place]  [place]       [place], [place]. Schedule an appointment as soon as possible for a visit in 1 week(s).   Specialty:  Family Medicine Why:  with Repeat CBC/BMP Contact information: [place] [address] [phone]          Allergies  Allergen Reactions  . Compazine [Prochlorperazine] Hives  . Prilosec [Omeprazole] Hives    Consultations:  None   Procedures/Studies: Dg Chest 2 View  Result Date: 12/11/2017 CLINICAL DATA:  Possible reaction to taking Toradol resulting and difficulty breathing and confusion. EXAM: CHEST - 2 VIEW COMPARISON:  None in PACs  FINDINGS: The lungs are adequately inflated. There is no focal infiltrate. There is no pleural effusion. The heart is top-normal in size. The pulmonary vascularity is normal. The mediastinum is normal in width. The trachea is midline. The bony thorax exhibits no acute abnormality. There is widening of the right AC joint. IMPRESSION: There is no active cardiopulmonary disease. Electronically Signed   By: David  Martinique M.D.   On: 12/11/2017 14:34   Ct Head Wo Contrast  Result Date: 12/05/2017 CLINICAL DATA:  Migraine headache x2 days EXAM: CT HEAD WITHOUT CONTRAST TECHNIQUE: Contiguous axial images were obtained from the base of the skull through the vertex without intravenous contrast. COMPARISON:  None. FINDINGS: Brain: No evidence of acute infarction, hemorrhage, hydrocephalus, extra-axial collection or mass lesion/mass effect. Vascular: No hyperdense vessel or unexpected calcification. Skull: Normal. Negative for fracture or focal lesion. Sinuses/Orbits: The visualized paranasal sinuses are essentially clear. The mastoid air cells are unopacified. Other: None. IMPRESSION: Normal head CT. Electronically Signed   By: Julian Hy M.D.   On: 12/05/2017 18:37   Nm Hepatobiliary Liver Func  Result Date: 12/11/2017 CLINICAL DATA:  Right upper quadrant abdominal pain. Low-grade fever. EXAM: NUCLEAR  MEDICINE HEPATOBILIARY IMAGING TECHNIQUE: Sequential images of the abdomen were obtained out to 60 minutes following intravenous administration of radiopharmaceutical. RADIOPHARMACEUTICALS:  7.83 mCi Tc-78m  Choletec IV COMPARISON:  CT abdomen pelvis of the same day. Right upper quadrant ultrasound of the same day. FINDINGS: Prompt uptake and biliary excretion of activity by the liver is seen. Gallbladder was not initially seen. Following administration of 3.0 mg morphine 2 hours after the initial injection, the gallbladder was visualized. Gallbladder activity is visualized, consistent with patency of cystic duct. Biliary activity passes into small bowel, consistent with patent common bile duct. IMPRESSION: Gallbladder opacification following administration of morphine without evidence for acute or chronic cholecystitis. Electronically Signed   By: San Morelle M.D.   On: 12/11/2017 22:20   Ct Renal Stone Study  Result Date: 12/11/2017 CLINICAL DATA:  Itching and fever. Headache. Abdominal distention. Elevated creatinine. EXAM: CT ABDOMEN AND PELVIS WITHOUT CONTRAST TECHNIQUE: Multidetector CT imaging of the abdomen and pelvis was performed following the standard protocol without IV contrast. COMPARISON:  None. FINDINGS: Lower chest: No acute abnormality. Hepatobiliary: Probable small cyst in the liver on series 2, image 26, too small to completely characterize. The liver and gallbladder are otherwise normal. Pancreas: Unremarkable. No pancreatic ductal dilatation or surrounding inflammatory changes. Spleen: Normal in size without focal abnormality. Adrenals/Urinary Tract: The adrenal glands are normal. The patient has a horseshoe kidney. There is fat stranding adjacent to both renal moieties, extending down the right pericolic gutter. No hydronephrosis on the right. Mild hydronephrosis on the left is identified with a prominent extrarenal pelvis. No ureterectasis or ureteral stones are identified.  Calcifications in the lower abdomen and pelvis appear to be adjacent to rather than within the ureters. The bladder is normal. Stomach/Bowel: The stomach and small bowel are normal. A few scattered colonic diverticuli are seen without diverticulitis. The colon is poorly evaluated due to lack of distention but no obvious abnormalities are noted. The appendix is not seen but there is no secondary evidence of appendicitis. Vascular/Lymphatic: The abdominal aorta is normal in caliber. No atherosclerosis. No lymphadenopathy. Reproductive: Uterus and bilateral adnexa are unremarkable. Other: No free air or free fluid. Musculoskeletal: No acute or significant osseous findings. IMPRESSION: 1. There is fat stranding in the lower abdomen and pelvis, particularly on the right, thought to arise  from the kidneys. The lack of hydronephrosis on the right in addition to the lack of renal stones would suggests this stranding is probably not due to a recently passed stone. Pyelonephritis should be considered. Recommend correlation with urinalysis and physical exam. 2. There is mild hydronephrosis on the left. No obstructing stones are seen. The finding could be due to a UPJ obstruction. The fact that the perinephric stranding is greater on the right than the left suggests that there is not an acute obstruction on the left. Recommend clinical correlation. 3. Scattered colonic diverticuli without diverticulitis. 4. The appendix is not visualized but there is no secondary evidence of appendicitis. Electronically Signed   By: Dorise Bullion III M.D   On: 12/11/2017 16:09   US Abdomen Limited Ruq  Result Date: 12/11/2017 CLINICAL DATA:  Right upper quadrant pain EXAM: ULTRASOUND ABDOMEN LIMITED RIGHT UPPER QUADRANT COMPARISON:  None. FINDINGS: Gallbladder: No gallstones or wall thickening visualized. No sonographic Murphy sign noted by sonographer. Common bile duct: Diameter: 6 mm in caliber. Liver: Simple cyst in the left lobe  measures 1.2 cm. No solid mass. Normal in echogenicity. Portal vein is patent on color Doppler imaging with normal direction of blood flow towards the liver. IMPRESSION: No acute pathology involving the liver, gallbladder, or biliary tree. Electronically Signed   By: Marybelle Killings M.D.   On: 12/11/2017 14:55      Subjective: Patient seen and examined at bedside.  She feels much better.  She is tolerating diet.  No overnight fever, nausea or vomiting.  Discharge Exam: Vitals:   12/13/17 2153 12/14/17 0444  BP: (!) 145/82 139/87  Pulse: 60 66  Resp: 16 16  Temp: 98.7 F (37.1 C) 98.4 F (36.9 C)  SpO2: 100% 96%   Vitals:   12/13/17 0800 12/13/17 1323 12/13/17 2153 12/14/17 0444  BP: 126/77 135/78 (!) 145/82 139/87  Pulse: 75 69 60 66  Resp: 18  16 16   Temp: 98.1 F (36.7 C) 98.1 F (36.7 C) 98.7 F (37.1 C) 98.4 F (36.9 C)  TempSrc: Oral Oral Oral Oral  SpO2: 98% 100% 100% 96%  Weight:      Height:        General: Pt is alert, awake, not in acute distress Cardiovascular: Rate controlled, S1/S2 + Respiratory: Bilateral decreased breath sounds at bases Abdominal: Soft, NT, ND, bowel sounds + Extremities: no edema, no cyanosis    The results of significant diagnostics from this hospitalization (including imaging, microbiology, ancillary and laboratory) are listed below for reference.     Microbiology: Recent Results (from the past 240 hour(s))  CSF culture     Status: None   Collection Time: 12/05/17  7:03 PM  Result Value Ref Range Status   Specimen Description CSF  Final   Special Requests   Final    NONE Performed at [facility], [place] [address], [place], [place] [address]    Gram Stain   Final    NO WBC SEEN NO ORGANISMS SEEN Gram Stain Report Called to,Read Back By and Verified With: S DOSTER,RN @2031  12/05/17 MKELLY Performed at [facility], [place] [address] [place], [place], [place] [address]    Culture   Final    NO GROWTH  3 DAYS Performed at [facility], [place] [address], [place], [place] [address]    Report Status 12/09/2017 FINAL  Final  Urine culture     Status: Abnormal   Collection Time: 12/11/17  1:34 PM  Result Value Ref Range Status   Specimen Description   Final    URINE, CLEAN CATCH Performed at [facility], [place] [address], [place], [place] [address]    Special Requests   Final    NONE Performed at [facility], [place] [address], [place], [place] [address]    Culture >=100,000 COLONIES/mL ESCHERICHIA COLI (A)  Final   Report Status 12/14/2017 FINAL  Final   Organism ID, Bacteria ESCHERICHIA COLI (A)  Final      Susceptibility   Escherichia coli - MIC*    AMPICILLIN <=2 SENSITIVE Sensitive     CEFAZOLIN <=4 SENSITIVE Sensitive     CEFTRIAXONE <=1 SENSITIVE Sensitive     CIPROFLOXACIN <=0.25 SENSITIVE Sensitive     GENTAMICIN <=1 SENSITIVE Sensitive     IMIPENEM <=0.25 SENSITIVE Sensitive     NITROFURANTOIN <=16 SENSITIVE Sensitive     TRIMETH/SULFA <=20 SENSITIVE Sensitive     AMPICILLIN/SULBACTAM <=2 SENSITIVE Sensitive     PIP/TAZO <=4 SENSITIVE Sensitive     Extended ESBL NEGATIVE Sensitive     * >=100,000 COLONIES/mL ESCHERICHIA COLI  Respiratory Panel by PCR     Status: None   Collection Time: 12/11/17  4:53 PM  Result Value Ref Range Status   Adenovirus NOT DETECTED NOT DETECTED Final   Coronavirus 229E NOT DETECTED NOT DETECTED Final   Coronavirus HKU1 NOT DETECTED NOT DETECTED Final   Coronavirus NL63 NOT DETECTED NOT DETECTED Final   Coronavirus OC43 NOT DETECTED NOT DETECTED Final   Metapneumovirus NOT DETECTED NOT DETECTED Final   Rhinovirus / Enterovirus NOT DETECTED NOT DETECTED Final   Influenza A NOT DETECTED NOT DETECTED Final   Influenza B NOT DETECTED NOT DETECTED Final   Parainfluenza Virus 1 NOT DETECTED NOT DETECTED Final   Parainfluenza Virus 2 NOT DETECTED NOT DETECTED Final   Parainfluenza Virus 3 NOT DETECTED NOT  DETECTED Final   Parainfluenza Virus 4 NOT DETECTED NOT DETECTED Final   Respiratory Syncytial Virus NOT DETECTED NOT DETECTED Final   Bordetella pertussis NOT DETECTED NOT DETECTED Final   Chlamydophila pneumoniae NOT DETECTED NOT DETECTED Final   Mycoplasma pneumoniae NOT DETECTED NOT DETECTED Final  Culture, blood (routine x 2)     Status: None (Preliminary result)   Collection Time: 12/11/17  6:41 PM  Result Value Ref Range Status   Specimen Description   Final    BLOOD LEFT HAND Performed at [facility], [place] [address], [place], [place] [address]    Special Requests   Final    BOTTLES DRAWN AEROBIC ONLY Blood Culture adequate volume Performed at [place] [address], [place], [place] [address]    Culture   Final    NO GROWTH 3 DAYS Performed at [facility], [place] [address], [place], [place] [address]    Report Status PENDING  Incomplete  Culture, blood (routine x 2)     Status: None (Preliminary result)   Collection Time: 12/11/17  7:00 PM  Result Value Ref Range Status   Specimen Description   Final    BLOOD LEFT HAND Performed at [place] [address], [place], [place] [address]    Special Requests   Final    BOTTLES DRAWN AEROBIC ONLY Blood Culture adequate volume Performed at [place] [address], [place], [place] [address]    Culture   Final    NO GROWTH 3 DAYS Performed at [facility], [place] [address] [place], [place] [address]    Report Status PENDING  Incomplete     Labs: BNP (last 3 results) No results for input(s): BNP in the last 8760 hours. Basic Metabolic Panel: Recent Labs  Lab 12/11/17 1400 12/11/17 2228 12/12/17 0508 12/13/17 0419 12/14/17 0503  NA 134*  --  137 141 142  K 3.4*  --  3.6 3.9 4.4  CL 98*  --  104 110 111  CO2 23  --  19* 20* 19*  GLUCOSE 86  --  92 108* 100*  BUN 44*  --  44* 39* 27*  CREATININE 3.38*  --  2.89*  2.18* 1.52*  CALCIUM 9.5  --  8.7* 8.3* 8.4*  MG  --  2.1  --  2.0 1.7   Liver Function Tests: Recent Labs  Lab 12/11/17 1400 12/12/17 0508 12/13/17 0419  AST 48* 39 94*  ALT 33 33 71*  ALKPHOS 299* 253* 257*  BILITOT 3.6* 2.0* 1.1  PROT 6.7 6.0* 5.4*  ALBUMIN 2.6* 2.2* 1.9*   Recent Labs  Lab 12/11/17 1400  LIPASE 31   No results for input(s): AMMONIA in the last 168 hours. CBC: Recent Labs  Lab 12/11/17 1400 12/12/17 0508 12/13/17 0419 12/14/17 0503  WBC 22.1* 19.4* 13.1* 11.7*  NEUTROABS 19.1*  --   --  8.4*  HGB 12.3 11.4* 10.5* 10.8*  HCT 34.2* 32.7* 30.0* 30.4*  MCV 89.8 89.8 89.0 88.9  PLT 285 311 313 373   Cardiac Enzymes: No results for input(s): CKTOTAL, CKMB, CKMBINDEX, TROPONINI in the last 168 hours. BNP: Invalid input(s): POCBNP CBG: No results for input(s): GLUCAP in the last 168 hours. D-Dimer No results for input(s): DDIMER in the last 72 hours. Hgb A1c Recent Labs    12/12/17 0508  HGBA1C 5.2   Lipid Profile No results for input(s): CHOL, HDL, LDLCALC, TRIG, CHOLHDL, LDLDIRECT in the last 72 hours. Thyroid function studies No results for input(s): TSH, T4TOTAL, T3FREE, THYROIDAB in the last 72 hours.  Invalid input(s): FREET3 Anemia work up No results for input(s): VITAMINB12, FOLATE, FERRITIN, TIBC, IRON, RETICCTPCT in the last 72 hours. Urinalysis    Component Value Date/Time   COLORURINE YELLOW 12/11/2017 1334   APPEARANCEUR CLOUDY (A) 12/11/2017 1334   LABSPEC 1.004 (L) 12/11/2017 1334   PHURINE 5.0 12/11/2017 1334   GLUCOSEU NEGATIVE 12/11/2017 1334   HGBUR MODERATE (A) 12/11/2017 1334   BILIRUBINUR NEGATIVE 12/11/2017 1334   KETONESUR NEGATIVE 12/11/2017 1334   PROTEINUR 30 (A) 12/11/2017 1334   NITRITE NEGATIVE 12/11/2017 1334   LEUKOCYTESUR LARGE (A) 12/11/2017 1334   Sepsis Labs Invalid input(s): PROCALCITONIN,  WBC,  LACTICIDVEN Microbiology Recent Results (from the past 240 hour(s))  CSF culture     Status:  None   Collection Time: 12/05/17  7:03 PM  Result Value Ref Range Status   Specimen Description CSF  Final   Special Requests   Final    NONE Performed at [facility], [place] [address], [place], [place] [address]    Gram Stain   Final    NO WBC SEEN NO ORGANISMS SEEN Gram Stain Report Called to,Read Back By and Verified With: S DOSTER,RN @2031  12/05/17 MKELLY Performed at [facility], [place] [address] [place], [place] [address]    Culture   Final    NO GROWTH 3 DAYS Performed at [facility], [place] [address] [place], [place] [address]    Report Status  12/09/2017 FINAL  Final  Urine culture     Status: Abnormal   Collection Time: 12/11/17  1:34 PM  Result Value Ref Range Status   Specimen Description   Final    URINE, CLEAN CATCH Performed at [facility], [place] [address] [place], [place] [address]    Special Requests   Final    NONE Performed at [facility], [place] [address], [place], [place] [address]    Culture >=100,000 COLONIES/mL ESCHERICHIA COLI (A)  Final   Report Status 12/14/2017 FINAL  Final   Organism ID, Bacteria ESCHERICHIA COLI (A)  Final      Susceptibility   Escherichia coli - MIC*    AMPICILLIN <=2 SENSITIVE Sensitive     CEFAZOLIN <=4 SENSITIVE Sensitive     CEFTRIAXONE <=1 SENSITIVE Sensitive     CIPROFLOXACIN <=0.25 SENSITIVE Sensitive     GENTAMICIN <=1 SENSITIVE Sensitive     IMIPENEM <=0.25 SENSITIVE Sensitive     NITROFURANTOIN <=16 SENSITIVE Sensitive     TRIMETH/SULFA <=20 SENSITIVE Sensitive     AMPICILLIN/SULBACTAM <=2 SENSITIVE Sensitive     PIP/TAZO <=4 SENSITIVE Sensitive     Extended ESBL NEGATIVE Sensitive     * >=100,000 COLONIES/mL ESCHERICHIA COLI  Respiratory Panel by PCR     Status: None   Collection Time: 12/11/17  4:53 PM  Result Value Ref Range Status   Adenovirus NOT DETECTED NOT DETECTED Final   Coronavirus 229E NOT DETECTED NOT DETECTED Final    Coronavirus HKU1 NOT DETECTED NOT DETECTED Final   Coronavirus NL63 NOT DETECTED NOT DETECTED Final   Coronavirus OC43 NOT DETECTED NOT DETECTED Final   Metapneumovirus NOT DETECTED NOT DETECTED Final   Rhinovirus / Enterovirus NOT DETECTED NOT DETECTED Final   Influenza A NOT DETECTED NOT DETECTED Final   Influenza B NOT DETECTED NOT DETECTED Final   Parainfluenza Virus 1 NOT DETECTED NOT DETECTED Final   Parainfluenza Virus 2 NOT DETECTED NOT DETECTED Final   Parainfluenza Virus 3 NOT DETECTED NOT DETECTED Final   Parainfluenza Virus 4 NOT DETECTED NOT DETECTED Final   Respiratory Syncytial Virus NOT DETECTED NOT DETECTED Final   Bordetella pertussis NOT DETECTED NOT DETECTED Final   Chlamydophila pneumoniae NOT DETECTED NOT DETECTED Final   Mycoplasma pneumoniae NOT DETECTED NOT DETECTED Final  Culture, blood (routine x 2)     Status: None (Preliminary result)   Collection Time: 12/11/17  6:41 PM  Result Value Ref Range Status   Specimen Description   Final    BLOOD LEFT HAND Performed at [facility], [place] [address], [place], [place] [address]    Special Requests   Final    BOTTLES DRAWN AEROBIC ONLY Blood Culture adequate volume Performed at [place] [address] [place], [place] [address]    Culture   Final    NO GROWTH 3 DAYS Performed at [facility], [place] [address], [place], [place] [address]    Report Status PENDING  Incomplete  Culture, blood (routine x 2)     Status: None (Preliminary result)   Collection Time: 12/11/17  7:00 PM  Result Value Ref Range Status   Specimen Description   Final    BLOOD LEFT HAND Performed at [place] [address], [place], [place] [address]    Special Requests   Final    BOTTLES DRAWN AEROBIC ONLY Blood Culture adequate volume Performed at [place]  [address] [place], [place] [address]    Culture   Final    NO GROWTH 3  DAYS Performed at [facility], [place] [address] [place], [place] [address]    Report Status PENDING  Incomplete     Time coordinating discharge: 35 minutes  SIGNED:   Aline August, MD  [facility] 12/14/2017, 9:55 AM Pager: [phone]  If 7PM-7AM, please contact night-coverage [email or web address] Password [email or web address]

## 2017-12-16 LAB — CULTURE, BLOOD (ROUTINE X 2)
CULTURE: NO GROWTH
Culture: NO GROWTH
Special Requests: ADEQUATE
Special Requests: ADEQUATE

## 2017-12-18 ENCOUNTER — Other Ambulatory Visit: Payer: Self-pay | Admitting: Urology

## 2017-12-18 DIAGNOSIS — N179 Acute kidney failure, unspecified: Secondary | ICD-10-CM | POA: Diagnosis not present

## 2017-12-18 DIAGNOSIS — R946 Abnormal results of thyroid function studies: Secondary | ICD-10-CM | POA: Diagnosis not present

## 2017-12-18 DIAGNOSIS — N170 Acute kidney failure with tubular necrosis: Secondary | ICD-10-CM | POA: Diagnosis not present

## 2017-12-18 DIAGNOSIS — D72829 Elevated white blood cell count, unspecified: Secondary | ICD-10-CM | POA: Diagnosis not present

## 2017-12-18 DIAGNOSIS — R740 Nonspecific elevation of levels of transaminase and lactic acid dehydrogenase [LDH]: Secondary | ICD-10-CM | POA: Diagnosis not present

## 2017-12-18 DIAGNOSIS — N130 Hydronephrosis with ureteropelvic junction obstruction: Secondary | ICD-10-CM | POA: Diagnosis not present

## 2017-12-18 DIAGNOSIS — N12 Tubulo-interstitial nephritis, not specified as acute or chronic: Secondary | ICD-10-CM | POA: Diagnosis not present

## 2017-12-18 DIAGNOSIS — N10 Acute tubulo-interstitial nephritis: Secondary | ICD-10-CM | POA: Diagnosis not present

## 2017-12-25 ENCOUNTER — Ambulatory Visit (HOSPITAL_COMMUNITY)
Admission: RE | Admit: 2017-12-25 | Discharge: 2017-12-25 | Disposition: A | Discharge status: Home / Self Care | Payer: BLUE CROSS/BLUE SHIELD | Source: Ambulatory Visit | Attending: Urology | Admitting: Urology

## 2017-12-25 ENCOUNTER — Encounter (HOSPITAL_COMMUNITY): Payer: Self-pay

## 2017-12-25 DIAGNOSIS — R93422 Abnormal radiologic findings on diagnostic imaging of left kidney: Secondary | ICD-10-CM | POA: Insufficient documentation

## 2017-12-25 DIAGNOSIS — Q631 Lobulated, fused and horseshoe kidney: Secondary | ICD-10-CM | POA: Insufficient documentation

## 2017-12-25 DIAGNOSIS — N1330 Unspecified hydronephrosis: Secondary | ICD-10-CM | POA: Diagnosis not present

## 2017-12-25 DIAGNOSIS — N130 Hydronephrosis with ureteropelvic junction obstruction: Secondary | ICD-10-CM

## 2017-12-25 DIAGNOSIS — R93421 Abnormal radiologic findings on diagnostic imaging of right kidney: Secondary | ICD-10-CM | POA: Diagnosis not present

## 2017-12-25 DIAGNOSIS — K7689 Other specified diseases of liver: Secondary | ICD-10-CM | POA: Insufficient documentation

## 2017-12-25 MED ORDER — IOPAMIDOL (ISOVUE-300) INJECTION 61%
100.0000 mL | Freq: Once | INTRAVENOUS | Status: AC | PRN
  Administered 2017-12-25: 100 mL via INTRAVENOUS

## 2018-01-01 ENCOUNTER — Encounter (HOSPITAL_COMMUNITY): Payer: BLUE CROSS/BLUE SHIELD

## 2018-01-04 DIAGNOSIS — M722 Plantar fascial fibromatosis: Secondary | ICD-10-CM | POA: Diagnosis not present

## 2018-01-04 DIAGNOSIS — M65872 Other synovitis and tenosynovitis, left ankle and foot: Secondary | ICD-10-CM | POA: Diagnosis not present

## 2018-01-07 ENCOUNTER — Encounter (HOSPITAL_COMMUNITY): Payer: BLUE CROSS/BLUE SHIELD

## 2018-01-11 DIAGNOSIS — M722 Plantar fascial fibromatosis: Secondary | ICD-10-CM | POA: Diagnosis not present

## 2018-01-11 DIAGNOSIS — M65872 Other synovitis and tenosynovitis, left ankle and foot: Secondary | ICD-10-CM | POA: Diagnosis not present

## 2018-01-14 DIAGNOSIS — M65872 Other synovitis and tenosynovitis, left ankle and foot: Secondary | ICD-10-CM | POA: Diagnosis not present

## 2018-01-14 DIAGNOSIS — M722 Plantar fascial fibromatosis: Secondary | ICD-10-CM | POA: Diagnosis not present

## 2018-01-17 DIAGNOSIS — R945 Abnormal results of liver function studies: Secondary | ICD-10-CM | POA: Diagnosis not present

## 2018-01-18 ENCOUNTER — Encounter (HOSPITAL_COMMUNITY)
Admission: RE | Admit: 2018-01-18 | Discharge: 2018-01-18 | Disposition: A | Discharge status: Home / Self Care | Payer: BLUE CROSS/BLUE SHIELD | Source: Ambulatory Visit | Attending: Urology | Admitting: Urology

## 2018-01-18 DIAGNOSIS — N1330 Unspecified hydronephrosis: Secondary | ICD-10-CM | POA: Diagnosis not present

## 2018-01-18 DIAGNOSIS — N130 Hydronephrosis with ureteropelvic junction obstruction: Secondary | ICD-10-CM | POA: Diagnosis not present

## 2018-01-18 DIAGNOSIS — M65872 Other synovitis and tenosynovitis, left ankle and foot: Secondary | ICD-10-CM | POA: Diagnosis not present

## 2018-01-18 DIAGNOSIS — M722 Plantar fascial fibromatosis: Secondary | ICD-10-CM | POA: Diagnosis not present

## 2018-01-18 MED ORDER — FUROSEMIDE 10 MG/ML IJ SOLN
50.0000 mg | Freq: Once | INTRAMUSCULAR | Status: AC
  Administered 2018-01-18: 50 mg via INTRAVENOUS

## 2018-01-18 MED ORDER — TECHNETIUM TC 99M MERTIATIDE
5.5000 | Freq: Once | INTRAVENOUS | Status: AC | PRN
  Administered 2018-01-18: 5.5 via INTRAVENOUS

## 2018-01-18 MED ORDER — FUROSEMIDE 10 MG/ML IJ SOLN
INTRAMUSCULAR | Status: AC
Start: 2018-01-18 — End: ?
  Filled 2018-01-18: qty 8

## 2018-01-24 DIAGNOSIS — N170 Acute kidney failure with tubular necrosis: Secondary | ICD-10-CM | POA: Diagnosis not present

## 2018-01-24 DIAGNOSIS — N10 Acute tubulo-interstitial nephritis: Secondary | ICD-10-CM | POA: Diagnosis not present

## 2018-01-24 DIAGNOSIS — N130 Hydronephrosis with ureteropelvic junction obstruction: Secondary | ICD-10-CM | POA: Diagnosis not present

## 2018-03-19 DIAGNOSIS — M722 Plantar fascial fibromatosis: Secondary | ICD-10-CM | POA: Diagnosis not present

## 2018-03-19 DIAGNOSIS — M12272 Villonodular synovitis (pigmented), left ankle and foot: Secondary | ICD-10-CM | POA: Diagnosis not present

## 2018-03-21 ENCOUNTER — Other Ambulatory Visit: Payer: Self-pay | Admitting: Family Medicine

## 2018-03-21 DIAGNOSIS — R590 Localized enlarged lymph nodes: Secondary | ICD-10-CM

## 2018-03-26 DIAGNOSIS — M65872 Other synovitis and tenosynovitis, left ankle and foot: Secondary | ICD-10-CM | POA: Diagnosis not present

## 2018-04-04 ENCOUNTER — Other Ambulatory Visit: Payer: Self-pay | Admitting: Family Medicine

## 2018-04-04 DIAGNOSIS — R590 Localized enlarged lymph nodes: Secondary | ICD-10-CM

## 2018-04-11 ENCOUNTER — Other Ambulatory Visit: Payer: BLUE CROSS/BLUE SHIELD

## 2018-04-25 ENCOUNTER — Ambulatory Visit
Admission: RE | Admit: 2018-04-25 | Discharge: 2018-04-25 | Disposition: A | Discharge status: Home / Self Care | Payer: BLUE CROSS/BLUE SHIELD | Source: Ambulatory Visit | Attending: Family Medicine | Admitting: Family Medicine

## 2018-04-25 DIAGNOSIS — R590 Localized enlarged lymph nodes: Secondary | ICD-10-CM

## 2018-04-25 DIAGNOSIS — R928 Other abnormal and inconclusive findings on diagnostic imaging of breast: Secondary | ICD-10-CM | POA: Diagnosis not present

## 2018-04-25 DIAGNOSIS — Z853 Personal history of malignant neoplasm of breast: Secondary | ICD-10-CM | POA: Diagnosis not present

## 2018-04-25 DIAGNOSIS — N6489 Other specified disorders of breast: Secondary | ICD-10-CM | POA: Diagnosis not present

## 2018-05-13 DIAGNOSIS — H2513 Age-related nuclear cataract, bilateral: Secondary | ICD-10-CM | POA: Diagnosis not present

## 2018-05-15 DIAGNOSIS — Z170 Estrogen receptor positive status [ER+]: Secondary | ICD-10-CM | POA: Diagnosis not present

## 2018-05-15 DIAGNOSIS — C50411 Malignant neoplasm of upper-outer quadrant of right female breast: Secondary | ICD-10-CM | POA: Diagnosis not present

## 2018-06-05 DIAGNOSIS — M25572 Pain in left ankle and joints of left foot: Secondary | ICD-10-CM | POA: Diagnosis not present

## 2018-06-05 DIAGNOSIS — M79672 Pain in left foot: Secondary | ICD-10-CM | POA: Diagnosis not present

## 2018-06-05 DIAGNOSIS — M12272 Villonodular synovitis (pigmented), left ankle and foot: Secondary | ICD-10-CM | POA: Diagnosis not present

## 2018-06-07 ENCOUNTER — Other Ambulatory Visit: Payer: Self-pay | Admitting: Podiatry

## 2018-06-07 DIAGNOSIS — G8929 Other chronic pain: Secondary | ICD-10-CM

## 2018-06-07 DIAGNOSIS — M25572 Pain in left ankle and joints of left foot: Principal | ICD-10-CM

## 2018-06-16 ENCOUNTER — Other Ambulatory Visit: Payer: BLUE CROSS/BLUE SHIELD

## 2018-06-20 ENCOUNTER — Ambulatory Visit
Admission: RE | Admit: 2018-06-20 | Discharge: 2018-06-20 | Disposition: A | Discharge status: Home / Self Care | Payer: BLUE CROSS/BLUE SHIELD | Source: Ambulatory Visit | Attending: Podiatry | Admitting: Podiatry

## 2018-06-20 DIAGNOSIS — M19072 Primary osteoarthritis, left ankle and foot: Secondary | ICD-10-CM | POA: Diagnosis not present

## 2018-06-20 DIAGNOSIS — G8929 Other chronic pain: Secondary | ICD-10-CM

## 2018-06-20 DIAGNOSIS — M25572 Pain in left ankle and joints of left foot: Principal | ICD-10-CM

## 2018-06-21 DIAGNOSIS — M65872 Other synovitis and tenosynovitis, left ankle and foot: Secondary | ICD-10-CM | POA: Diagnosis not present

## 2018-06-21 DIAGNOSIS — M25572 Pain in left ankle and joints of left foot: Secondary | ICD-10-CM | POA: Diagnosis not present

## 2018-06-27 DIAGNOSIS — R8290 Unspecified abnormal findings in urine: Secondary | ICD-10-CM | POA: Diagnosis not present

## 2018-07-12 DIAGNOSIS — M722 Plantar fascial fibromatosis: Secondary | ICD-10-CM | POA: Diagnosis not present

## 2018-07-12 DIAGNOSIS — M25572 Pain in left ankle and joints of left foot: Secondary | ICD-10-CM | POA: Diagnosis not present

## 2018-08-09 DIAGNOSIS — M25572 Pain in left ankle and joints of left foot: Secondary | ICD-10-CM | POA: Diagnosis not present

## 2018-08-09 DIAGNOSIS — M722 Plantar fascial fibromatosis: Secondary | ICD-10-CM | POA: Diagnosis not present

## 2018-08-26 ENCOUNTER — Other Ambulatory Visit: Payer: Self-pay | Admitting: Family Medicine

## 2018-08-26 DIAGNOSIS — N63 Unspecified lump in unspecified breast: Secondary | ICD-10-CM

## 2018-08-27 ENCOUNTER — Ambulatory Visit
Admission: RE | Admit: 2018-08-27 | Discharge: 2018-08-27 | Disposition: A | Discharge status: Home / Self Care | Payer: BLUE CROSS/BLUE SHIELD | Source: Ambulatory Visit | Attending: Family Medicine | Admitting: Family Medicine

## 2018-08-27 DIAGNOSIS — Z853 Personal history of malignant neoplasm of breast: Secondary | ICD-10-CM | POA: Diagnosis not present

## 2018-08-27 DIAGNOSIS — R928 Other abnormal and inconclusive findings on diagnostic imaging of breast: Secondary | ICD-10-CM | POA: Diagnosis not present

## 2018-08-27 DIAGNOSIS — N63 Unspecified lump in unspecified breast: Secondary | ICD-10-CM

## 2018-08-27 HISTORY — DX: Personal history of irradiation: Z92.3

## 2018-09-06 DIAGNOSIS — M25572 Pain in left ankle and joints of left foot: Secondary | ICD-10-CM | POA: Diagnosis not present

## 2018-09-20 DIAGNOSIS — C50411 Malignant neoplasm of upper-outer quadrant of right female breast: Secondary | ICD-10-CM | POA: Diagnosis not present

## 2018-09-25 DIAGNOSIS — M65872 Other synovitis and tenosynovitis, left ankle and foot: Secondary | ICD-10-CM | POA: Diagnosis not present

## 2018-09-25 DIAGNOSIS — M722 Plantar fascial fibromatosis: Secondary | ICD-10-CM | POA: Diagnosis not present

## 2018-09-25 DIAGNOSIS — M25572 Pain in left ankle and joints of left foot: Secondary | ICD-10-CM | POA: Diagnosis not present

## 2018-09-30 DIAGNOSIS — M65872 Other synovitis and tenosynovitis, left ankle and foot: Secondary | ICD-10-CM | POA: Diagnosis not present

## 2018-09-30 DIAGNOSIS — M25572 Pain in left ankle and joints of left foot: Secondary | ICD-10-CM | POA: Diagnosis not present

## 2018-09-30 DIAGNOSIS — M722 Plantar fascial fibromatosis: Secondary | ICD-10-CM | POA: Diagnosis not present

## 2018-10-02 DIAGNOSIS — M25572 Pain in left ankle and joints of left foot: Secondary | ICD-10-CM | POA: Diagnosis not present

## 2018-10-02 DIAGNOSIS — M65872 Other synovitis and tenosynovitis, left ankle and foot: Secondary | ICD-10-CM | POA: Diagnosis not present

## 2018-10-02 DIAGNOSIS — M722 Plantar fascial fibromatosis: Secondary | ICD-10-CM | POA: Diagnosis not present

## 2018-10-04 DIAGNOSIS — M722 Plantar fascial fibromatosis: Secondary | ICD-10-CM | POA: Diagnosis not present

## 2018-10-04 DIAGNOSIS — M25572 Pain in left ankle and joints of left foot: Secondary | ICD-10-CM | POA: Diagnosis not present

## 2018-10-07 DIAGNOSIS — M65872 Other synovitis and tenosynovitis, left ankle and foot: Secondary | ICD-10-CM | POA: Diagnosis not present

## 2018-10-07 DIAGNOSIS — M722 Plantar fascial fibromatosis: Secondary | ICD-10-CM | POA: Diagnosis not present

## 2018-10-07 DIAGNOSIS — M25572 Pain in left ankle and joints of left foot: Secondary | ICD-10-CM | POA: Diagnosis not present

## 2018-10-09 DIAGNOSIS — M722 Plantar fascial fibromatosis: Secondary | ICD-10-CM | POA: Diagnosis not present

## 2018-10-09 DIAGNOSIS — M25572 Pain in left ankle and joints of left foot: Secondary | ICD-10-CM | POA: Diagnosis not present

## 2018-10-09 DIAGNOSIS — M65872 Other synovitis and tenosynovitis, left ankle and foot: Secondary | ICD-10-CM | POA: Diagnosis not present

## 2018-10-12 DIAGNOSIS — H109 Unspecified conjunctivitis: Secondary | ICD-10-CM | POA: Diagnosis not present

## 2018-10-14 DIAGNOSIS — M65872 Other synovitis and tenosynovitis, left ankle and foot: Secondary | ICD-10-CM | POA: Diagnosis not present

## 2018-10-14 DIAGNOSIS — M25572 Pain in left ankle and joints of left foot: Secondary | ICD-10-CM | POA: Diagnosis not present

## 2018-10-14 DIAGNOSIS — M722 Plantar fascial fibromatosis: Secondary | ICD-10-CM | POA: Diagnosis not present

## 2018-10-16 DIAGNOSIS — M722 Plantar fascial fibromatosis: Secondary | ICD-10-CM | POA: Diagnosis not present

## 2018-10-16 DIAGNOSIS — M65872 Other synovitis and tenosynovitis, left ankle and foot: Secondary | ICD-10-CM | POA: Diagnosis not present

## 2018-10-16 DIAGNOSIS — M25572 Pain in left ankle and joints of left foot: Secondary | ICD-10-CM | POA: Diagnosis not present

## 2018-10-21 DIAGNOSIS — M65872 Other synovitis and tenosynovitis, left ankle and foot: Secondary | ICD-10-CM | POA: Diagnosis not present

## 2018-10-21 DIAGNOSIS — M722 Plantar fascial fibromatosis: Secondary | ICD-10-CM | POA: Diagnosis not present

## 2018-10-21 DIAGNOSIS — M25572 Pain in left ankle and joints of left foot: Secondary | ICD-10-CM | POA: Diagnosis not present

## 2018-11-08 DIAGNOSIS — M12272 Villonodular synovitis (pigmented), left ankle and foot: Secondary | ICD-10-CM | POA: Diagnosis not present

## 2018-11-14 DIAGNOSIS — M722 Plantar fascial fibromatosis: Secondary | ICD-10-CM | POA: Diagnosis not present

## 2018-11-14 DIAGNOSIS — M65872 Other synovitis and tenosynovitis, left ankle and foot: Secondary | ICD-10-CM | POA: Diagnosis not present

## 2018-11-14 DIAGNOSIS — M25572 Pain in left ankle and joints of left foot: Secondary | ICD-10-CM | POA: Diagnosis not present

## 2018-11-21 DIAGNOSIS — M65872 Other synovitis and tenosynovitis, left ankle and foot: Secondary | ICD-10-CM | POA: Diagnosis not present

## 2018-11-21 DIAGNOSIS — M25572 Pain in left ankle and joints of left foot: Secondary | ICD-10-CM | POA: Diagnosis not present

## 2018-11-21 DIAGNOSIS — M722 Plantar fascial fibromatosis: Secondary | ICD-10-CM | POA: Diagnosis not present

## 2018-11-26 DIAGNOSIS — F321 Major depressive disorder, single episode, moderate: Secondary | ICD-10-CM | POA: Diagnosis not present

## 2018-11-26 DIAGNOSIS — Z0000 Encounter for general adult medical examination without abnormal findings: Secondary | ICD-10-CM | POA: Diagnosis not present

## 2018-11-26 DIAGNOSIS — F419 Anxiety disorder, unspecified: Secondary | ICD-10-CM | POA: Diagnosis not present

## 2018-11-26 DIAGNOSIS — E7800 Pure hypercholesterolemia, unspecified: Secondary | ICD-10-CM | POA: Diagnosis not present

## 2018-12-04 DIAGNOSIS — E7800 Pure hypercholesterolemia, unspecified: Secondary | ICD-10-CM | POA: Diagnosis not present

## 2018-12-04 DIAGNOSIS — R944 Abnormal results of kidney function studies: Secondary | ICD-10-CM | POA: Diagnosis not present

## 2018-12-04 DIAGNOSIS — C50411 Malignant neoplasm of upper-outer quadrant of right female breast: Secondary | ICD-10-CM | POA: Diagnosis not present

## 2018-12-04 DIAGNOSIS — E559 Vitamin D deficiency, unspecified: Secondary | ICD-10-CM | POA: Diagnosis not present

## 2018-12-04 DIAGNOSIS — C50911 Malignant neoplasm of unspecified site of right female breast: Secondary | ICD-10-CM | POA: Diagnosis not present

## 2018-12-04 DIAGNOSIS — Z6831 Body mass index (BMI) 31.0-31.9, adult: Secondary | ICD-10-CM | POA: Diagnosis not present

## 2018-12-04 DIAGNOSIS — Z713 Dietary counseling and surveillance: Secondary | ICD-10-CM | POA: Diagnosis not present

## 2018-12-04 DIAGNOSIS — Z8639 Personal history of other endocrine, nutritional and metabolic disease: Secondary | ICD-10-CM | POA: Diagnosis not present

## 2018-12-11 DIAGNOSIS — Z9221 Personal history of antineoplastic chemotherapy: Secondary | ICD-10-CM | POA: Diagnosis not present

## 2018-12-11 DIAGNOSIS — N6311 Unspecified lump in the right breast, upper outer quadrant: Secondary | ICD-10-CM | POA: Diagnosis not present

## 2018-12-11 DIAGNOSIS — C50411 Malignant neoplasm of upper-outer quadrant of right female breast: Secondary | ICD-10-CM | POA: Diagnosis not present

## 2018-12-11 DIAGNOSIS — Z923 Personal history of irradiation: Secondary | ICD-10-CM | POA: Diagnosis not present

## 2018-12-11 DIAGNOSIS — C50912 Malignant neoplasm of unspecified site of left female breast: Secondary | ICD-10-CM | POA: Diagnosis not present

## 2018-12-11 DIAGNOSIS — Z170 Estrogen receptor positive status [ER+]: Secondary | ICD-10-CM | POA: Diagnosis not present

## 2018-12-11 DIAGNOSIS — Z79811 Long term (current) use of aromatase inhibitors: Secondary | ICD-10-CM | POA: Diagnosis not present

## 2018-12-11 DIAGNOSIS — N6313 Unspecified lump in the right breast, lower outer quadrant: Secondary | ICD-10-CM | POA: Diagnosis not present

## 2018-12-11 DIAGNOSIS — C50911 Malignant neoplasm of unspecified site of right female breast: Secondary | ICD-10-CM | POA: Diagnosis not present

## 2018-12-16 DIAGNOSIS — Z780 Asymptomatic menopausal state: Secondary | ICD-10-CM | POA: Diagnosis not present

## 2019-01-06 DIAGNOSIS — M19172 Post-traumatic osteoarthritis, left ankle and foot: Secondary | ICD-10-CM | POA: Diagnosis not present

## 2019-01-06 DIAGNOSIS — M65872 Other synovitis and tenosynovitis, left ankle and foot: Secondary | ICD-10-CM | POA: Diagnosis not present

## 2019-01-06 DIAGNOSIS — M722 Plantar fascial fibromatosis: Secondary | ICD-10-CM | POA: Diagnosis not present

## 2019-01-06 DIAGNOSIS — M24272 Disorder of ligament, left ankle: Secondary | ICD-10-CM | POA: Diagnosis not present

## 2019-03-05 DIAGNOSIS — C50911 Malignant neoplasm of unspecified site of right female breast: Secondary | ICD-10-CM | POA: Diagnosis not present

## 2019-03-11 DIAGNOSIS — F411 Generalized anxiety disorder: Secondary | ICD-10-CM | POA: Diagnosis not present

## 2019-03-20 DIAGNOSIS — F411 Generalized anxiety disorder: Secondary | ICD-10-CM | POA: Diagnosis not present

## 2019-03-21 DIAGNOSIS — N130 Hydronephrosis with ureteropelvic junction obstruction: Secondary | ICD-10-CM | POA: Diagnosis not present

## 2019-03-21 DIAGNOSIS — N10 Acute tubulo-interstitial nephritis: Secondary | ICD-10-CM | POA: Diagnosis not present

## 2019-03-24 DIAGNOSIS — Z20828 Contact with and (suspected) exposure to other viral communicable diseases: Secondary | ICD-10-CM | POA: Diagnosis not present

## 2019-03-25 DIAGNOSIS — D120 Benign neoplasm of cecum: Secondary | ICD-10-CM | POA: Diagnosis not present

## 2019-03-25 DIAGNOSIS — K6389 Other specified diseases of intestine: Secondary | ICD-10-CM | POA: Diagnosis not present

## 2019-03-25 DIAGNOSIS — Z1211 Encounter for screening for malignant neoplasm of colon: Secondary | ICD-10-CM | POA: Diagnosis not present

## 2019-03-25 DIAGNOSIS — D123 Benign neoplasm of transverse colon: Secondary | ICD-10-CM | POA: Diagnosis not present

## 2019-03-26 DIAGNOSIS — F411 Generalized anxiety disorder: Secondary | ICD-10-CM | POA: Diagnosis not present

## 2019-04-03 DIAGNOSIS — F411 Generalized anxiety disorder: Secondary | ICD-10-CM | POA: Diagnosis not present

## 2019-04-09 DIAGNOSIS — F411 Generalized anxiety disorder: Secondary | ICD-10-CM | POA: Diagnosis not present

## 2019-04-16 DIAGNOSIS — F411 Generalized anxiety disorder: Secondary | ICD-10-CM | POA: Diagnosis not present

## 2019-04-23 DIAGNOSIS — F411 Generalized anxiety disorder: Secondary | ICD-10-CM | POA: Diagnosis not present

## 2019-04-30 DIAGNOSIS — F411 Generalized anxiety disorder: Secondary | ICD-10-CM | POA: Diagnosis not present

## 2019-05-06 DIAGNOSIS — F411 Generalized anxiety disorder: Secondary | ICD-10-CM | POA: Diagnosis not present

## 2019-05-13 DIAGNOSIS — F411 Generalized anxiety disorder: Secondary | ICD-10-CM | POA: Diagnosis not present

## 2019-05-20 DIAGNOSIS — F411 Generalized anxiety disorder: Secondary | ICD-10-CM | POA: Diagnosis not present

## 2019-05-27 DIAGNOSIS — F411 Generalized anxiety disorder: Secondary | ICD-10-CM | POA: Diagnosis not present

## 2019-05-28 DIAGNOSIS — F419 Anxiety disorder, unspecified: Secondary | ICD-10-CM | POA: Diagnosis not present

## 2019-05-28 DIAGNOSIS — N1831 Chronic kidney disease, stage 3a: Secondary | ICD-10-CM | POA: Diagnosis not present

## 2019-05-28 DIAGNOSIS — C50919 Malignant neoplasm of unspecified site of unspecified female breast: Secondary | ICD-10-CM | POA: Diagnosis not present

## 2019-05-28 DIAGNOSIS — F321 Major depressive disorder, single episode, moderate: Secondary | ICD-10-CM | POA: Diagnosis not present

## 2019-06-24 DIAGNOSIS — R922 Inconclusive mammogram: Secondary | ICD-10-CM | POA: Diagnosis not present

## 2019-06-24 DIAGNOSIS — C50411 Malignant neoplasm of upper-outer quadrant of right female breast: Secondary | ICD-10-CM | POA: Diagnosis not present

## 2019-09-02 DIAGNOSIS — R5383 Other fatigue: Secondary | ICD-10-CM | POA: Diagnosis not present

## 2019-09-02 DIAGNOSIS — J069 Acute upper respiratory infection, unspecified: Secondary | ICD-10-CM | POA: Diagnosis not present

## 2019-09-02 DIAGNOSIS — Z20828 Contact with and (suspected) exposure to other viral communicable diseases: Secondary | ICD-10-CM | POA: Diagnosis not present

## 2019-09-02 DIAGNOSIS — Z03818 Encounter for observation for suspected exposure to other biological agents ruled out: Secondary | ICD-10-CM | POA: Diagnosis not present

## 2019-09-05 DIAGNOSIS — Z03818 Encounter for observation for suspected exposure to other biological agents ruled out: Secondary | ICD-10-CM | POA: Diagnosis not present

## 2019-09-05 DIAGNOSIS — Z20828 Contact with and (suspected) exposure to other viral communicable diseases: Secondary | ICD-10-CM | POA: Diagnosis not present

## 2019-09-24 DIAGNOSIS — C50411 Malignant neoplasm of upper-outer quadrant of right female breast: Secondary | ICD-10-CM | POA: Diagnosis not present

## 2019-09-24 DIAGNOSIS — Z170 Estrogen receptor positive status [ER+]: Secondary | ICD-10-CM | POA: Diagnosis not present

## 2019-09-24 DIAGNOSIS — Z9221 Personal history of antineoplastic chemotherapy: Secondary | ICD-10-CM | POA: Diagnosis not present

## 2019-09-24 DIAGNOSIS — C50911 Malignant neoplasm of unspecified site of right female breast: Secondary | ICD-10-CM | POA: Diagnosis not present

## 2019-09-24 DIAGNOSIS — R079 Chest pain, unspecified: Secondary | ICD-10-CM | POA: Diagnosis not present

## 2019-09-24 DIAGNOSIS — Z90722 Acquired absence of ovaries, bilateral: Secondary | ICD-10-CM | POA: Diagnosis not present

## 2019-09-24 DIAGNOSIS — Z923 Personal history of irradiation: Secondary | ICD-10-CM | POA: Diagnosis not present

## 2019-09-24 DIAGNOSIS — Z79811 Long term (current) use of aromatase inhibitors: Secondary | ICD-10-CM | POA: Diagnosis not present

## 2019-09-24 DIAGNOSIS — Z780 Asymptomatic menopausal state: Secondary | ICD-10-CM | POA: Diagnosis not present

## 2019-09-24 DIAGNOSIS — Z9011 Acquired absence of right breast and nipple: Secondary | ICD-10-CM | POA: Diagnosis not present

## 2019-10-14 DIAGNOSIS — Z23 Encounter for immunization: Secondary | ICD-10-CM | POA: Diagnosis not present

## 2019-10-14 DIAGNOSIS — S61219A Laceration without foreign body of unspecified finger without damage to nail, initial encounter: Secondary | ICD-10-CM | POA: Diagnosis not present

## 2019-11-12 DIAGNOSIS — H43812 Vitreous degeneration, left eye: Secondary | ICD-10-CM | POA: Diagnosis not present

## 2019-11-19 IMAGING — CT CT ABDOMEN WO/W CM
3 of 12 series · 12 of 46 positions shown, 18 images · IV contrast (iopamidol)
Comparison: Multiple exams, including CT scan 12/11/2017

CLINICAL DATA: Hydronephrosis in UPJ obstruction for 2 weeks.
History of breast cancer. Recent urinary tract infection.

EXAM:
CT ABDOMEN WITHOUT AND WITH CONTRAST
TECHNIQUE: Multidetector CT imaging of the abdomen was performed following the
standard protocol before and following the bolus administration of
intravenous contrast.
CONTRAST:  100mL FWSFXD-RSS IOPAMIDOL (FWSFXD-RSS) INJECTION 61%

[Series 2: axial pre · axial · non-contrast · 0.98mm/px · z∈[-260,-92]mm · 5 of 86 slices shown, 10 images]
[im 15/86  soft-tissue]
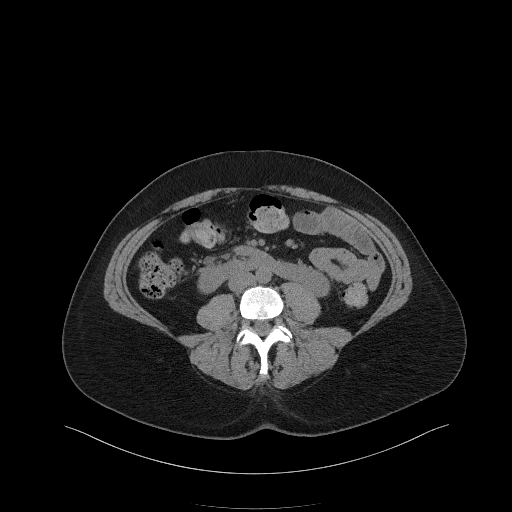
[im 15/86  bone]
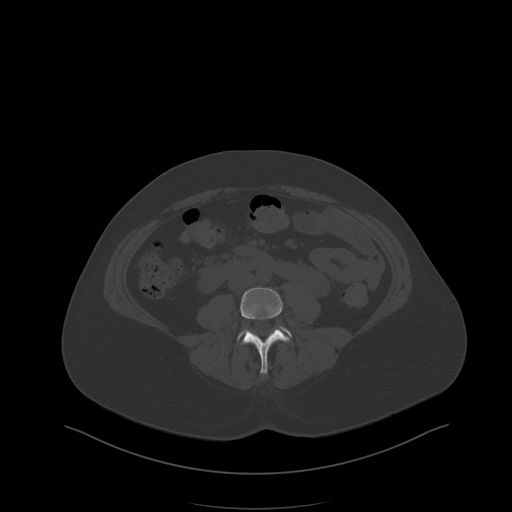
[im 29/86  soft-tissue]
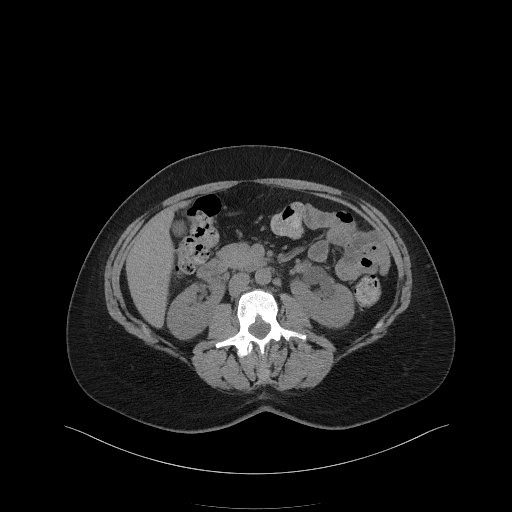
[im 29/86  lung]
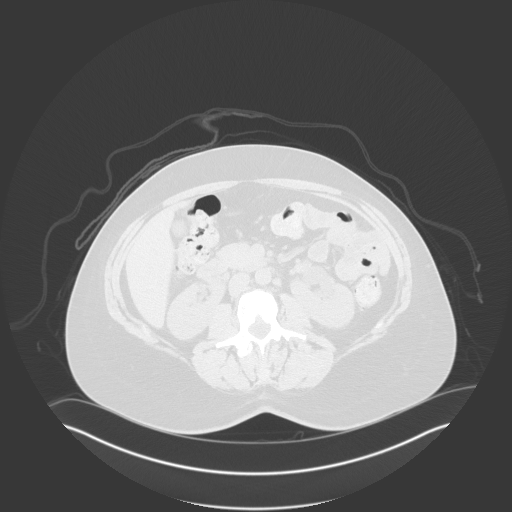
[im 43/86  soft-tissue]
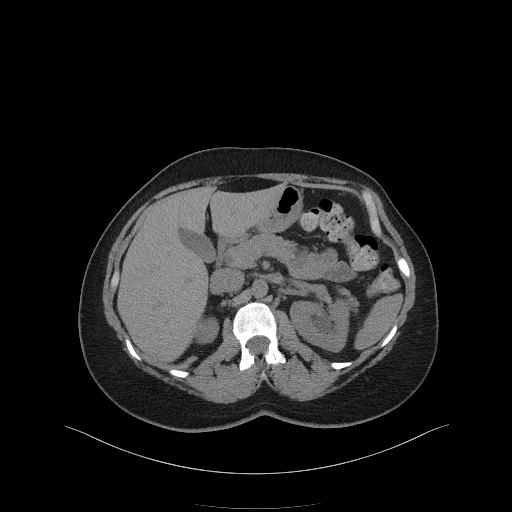
[im 43/86  lung]
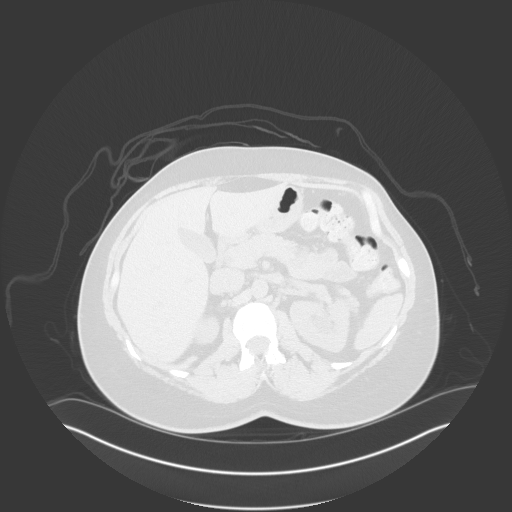
[im 57/86  soft-tissue]
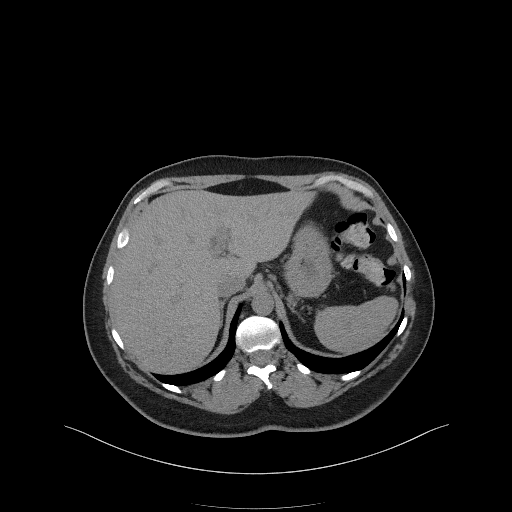
[im 57/86  lung]
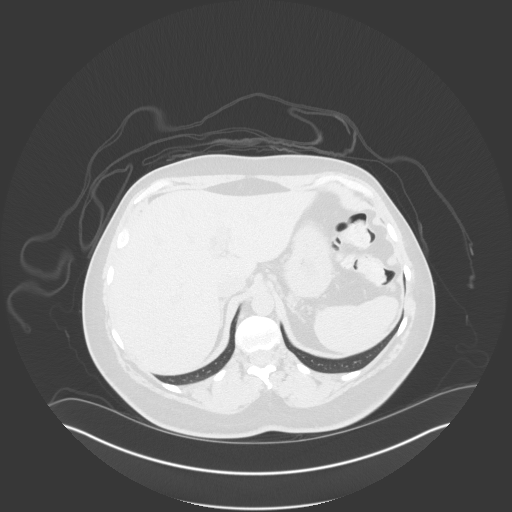
[im 71/86  soft-tissue]
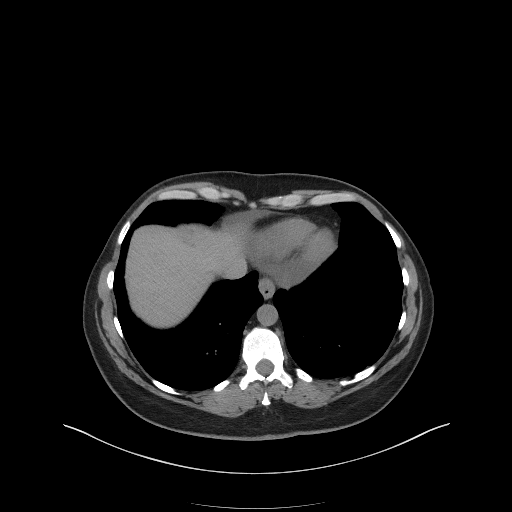
[im 71/86  lung]
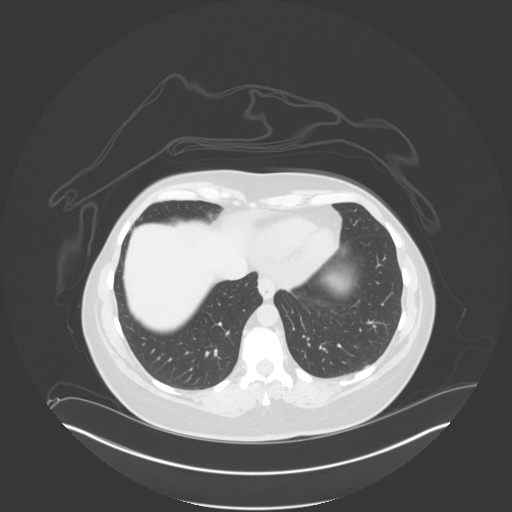

[Series 4: coronal pre · coronal · non-contrast · 0.53mm/px · 2 of 86 slices shown, 3 images]
[im 29/86  soft-tissue]
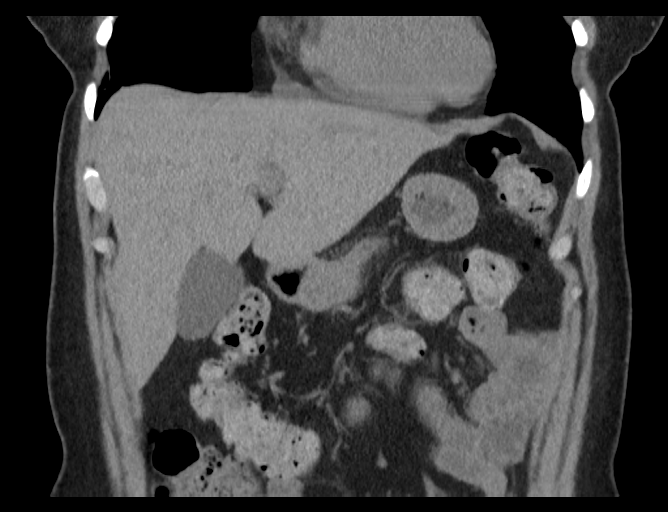
[im 29/86  bone]
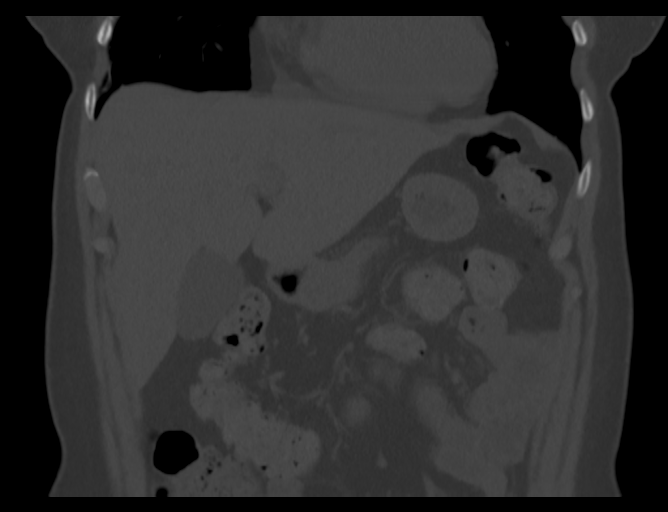
[im 57/86  soft-tissue]
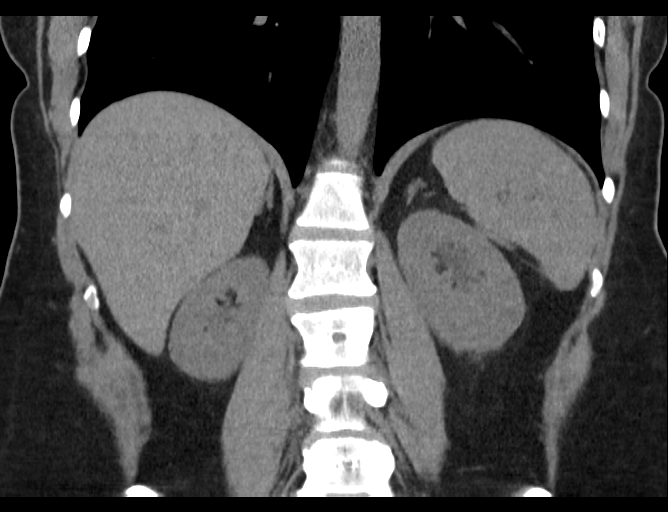

[Series 11: axial nephro · axial · 0.88mm/px · z∈[-281,-110]mm · 5 of 87 slices shown]
[im 15/87  soft-tissue]
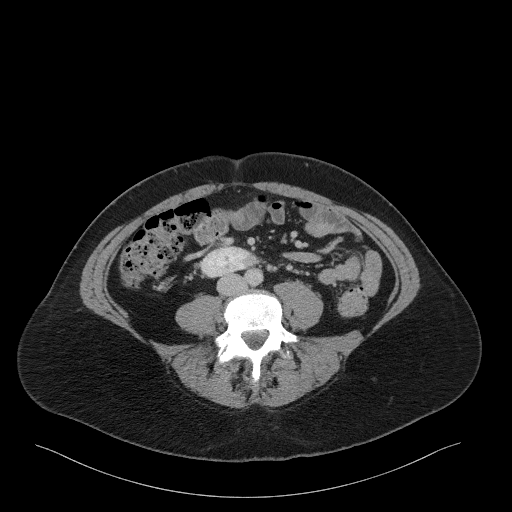
[im 29/87  soft-tissue]
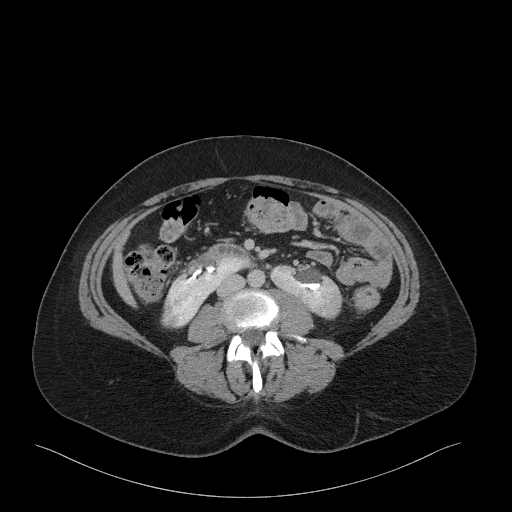
[im 44/87  soft-tissue]
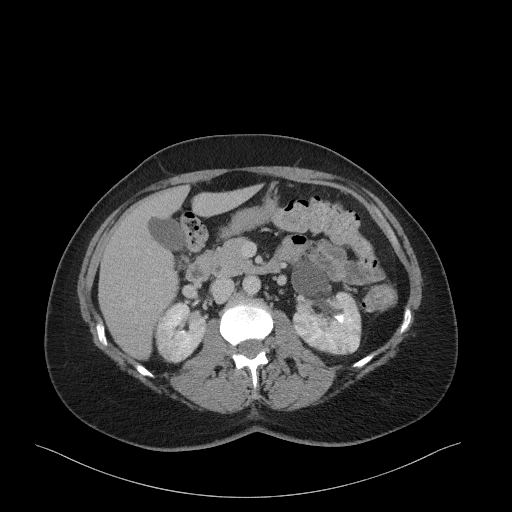
[im 58/87  soft-tissue]
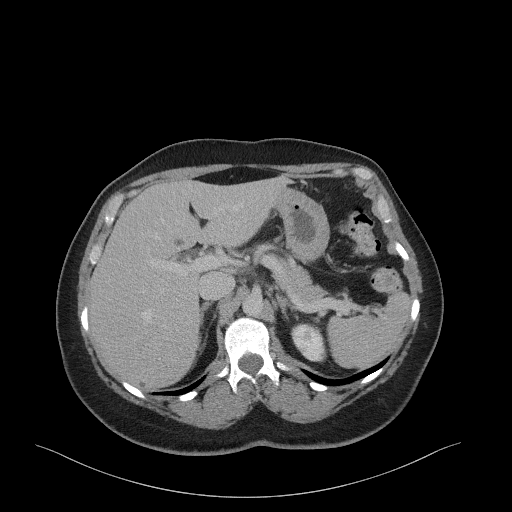
[im 72/87  soft-tissue]
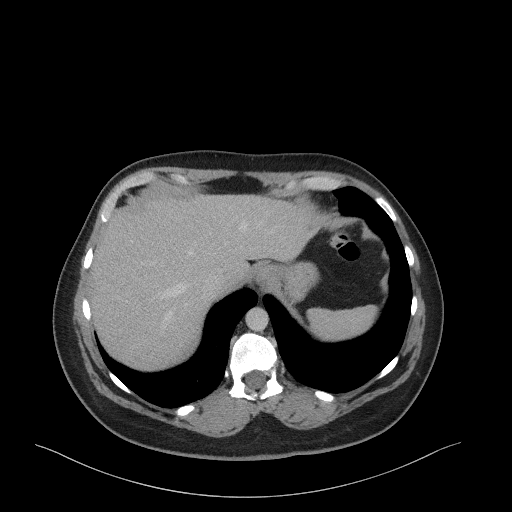

[12 of 46 positions shown; findings below may reference images not displayed]

FINDINGS: Lower chest: Stable mild peripheral scarring inferiorly in the right
middle lobe.

Hepatobiliary: Several small fluid density lesions cysts, although
of the liver are likely some such as the 0.7 by 0.5 cm lesion
inferiorly in the right hepatic lobe on image 49/6 are technically
too small to characterize. Gallbladder unremarkable. No biliary
dilatation.

Pancreas: Unremarkable

Spleen: Unremarkable

Adrenals/Urinary Tract: Adrenal glands normal.

Horseshoe kidney with bridging parenchyma below the inferior
mesenteric artery. Moderate left hydronephrosis extending to the
ureteropelvic junction without mass or visible cause identified,
although there are some adjacent crossing vessels.

Faint patchy hypodensities in the renal parenchymal bilaterally on
delayed images.

Stomach/Bowel: Unremarkable

Vascular/Lymphatic: Unremarkable

Other: No supplemental non-categorized findings.

Musculoskeletal: Unremarkable
IMPRESSION: 1. Moderate left hydronephrosis terminating at the UPJ without a
definite cause. Horseshoe kidney with bilateral patchy
hypoenhancement in both kidneys which could be a manifestation of
acute interstitial nephritis, vasculitis, or infection-correlate
with urine analysis.
2. Hepatic cysts.

## 2019-12-10 DIAGNOSIS — C50912 Malignant neoplasm of unspecified site of left female breast: Secondary | ICD-10-CM | POA: Diagnosis not present

## 2020-01-13 DIAGNOSIS — M9905 Segmental and somatic dysfunction of pelvic region: Secondary | ICD-10-CM | POA: Diagnosis not present

## 2020-01-13 DIAGNOSIS — M25572 Pain in left ankle and joints of left foot: Secondary | ICD-10-CM | POA: Diagnosis not present

## 2020-01-13 DIAGNOSIS — M25571 Pain in right ankle and joints of right foot: Secondary | ICD-10-CM | POA: Diagnosis not present

## 2020-01-13 DIAGNOSIS — M9904 Segmental and somatic dysfunction of sacral region: Secondary | ICD-10-CM | POA: Diagnosis not present

## 2020-01-15 DIAGNOSIS — M25572 Pain in left ankle and joints of left foot: Secondary | ICD-10-CM | POA: Diagnosis not present

## 2020-01-15 DIAGNOSIS — M9904 Segmental and somatic dysfunction of sacral region: Secondary | ICD-10-CM | POA: Diagnosis not present

## 2020-01-15 DIAGNOSIS — M25571 Pain in right ankle and joints of right foot: Secondary | ICD-10-CM | POA: Diagnosis not present

## 2020-01-15 DIAGNOSIS — M9905 Segmental and somatic dysfunction of pelvic region: Secondary | ICD-10-CM | POA: Diagnosis not present

## 2020-01-23 DIAGNOSIS — H43812 Vitreous degeneration, left eye: Secondary | ICD-10-CM | POA: Diagnosis not present

## 2020-01-27 DIAGNOSIS — M9905 Segmental and somatic dysfunction of pelvic region: Secondary | ICD-10-CM | POA: Diagnosis not present

## 2020-01-27 DIAGNOSIS — R079 Chest pain, unspecified: Secondary | ICD-10-CM | POA: Diagnosis not present

## 2020-01-27 DIAGNOSIS — M25571 Pain in right ankle and joints of right foot: Secondary | ICD-10-CM | POA: Diagnosis not present

## 2020-01-27 DIAGNOSIS — M25572 Pain in left ankle and joints of left foot: Secondary | ICD-10-CM | POA: Diagnosis not present

## 2020-01-27 DIAGNOSIS — M9904 Segmental and somatic dysfunction of sacral region: Secondary | ICD-10-CM | POA: Diagnosis not present

## 2020-01-28 ENCOUNTER — Other Ambulatory Visit: Payer: Self-pay | Admitting: *Deleted

## 2020-01-28 DIAGNOSIS — R0602 Shortness of breath: Secondary | ICD-10-CM

## 2020-01-28 DIAGNOSIS — R079 Chest pain, unspecified: Secondary | ICD-10-CM

## 2020-01-28 DIAGNOSIS — R002 Palpitations: Secondary | ICD-10-CM

## 2020-01-29 ENCOUNTER — Encounter: Payer: Self-pay | Admitting: *Deleted

## 2020-01-29 ENCOUNTER — Other Ambulatory Visit: Payer: Self-pay | Admitting: *Deleted

## 2020-01-29 DIAGNOSIS — R0602 Shortness of breath: Secondary | ICD-10-CM

## 2020-01-29 DIAGNOSIS — M9905 Segmental and somatic dysfunction of pelvic region: Secondary | ICD-10-CM | POA: Diagnosis not present

## 2020-01-29 DIAGNOSIS — M9904 Segmental and somatic dysfunction of sacral region: Secondary | ICD-10-CM | POA: Diagnosis not present

## 2020-01-29 DIAGNOSIS — R079 Chest pain, unspecified: Secondary | ICD-10-CM

## 2020-01-29 DIAGNOSIS — M25572 Pain in left ankle and joints of left foot: Secondary | ICD-10-CM | POA: Diagnosis not present

## 2020-01-29 DIAGNOSIS — M25571 Pain in right ankle and joints of right foot: Secondary | ICD-10-CM | POA: Diagnosis not present

## 2020-01-29 DIAGNOSIS — R002 Palpitations: Secondary | ICD-10-CM

## 2020-02-02 DIAGNOSIS — M25572 Pain in left ankle and joints of left foot: Secondary | ICD-10-CM | POA: Diagnosis not present

## 2020-02-02 DIAGNOSIS — M25571 Pain in right ankle and joints of right foot: Secondary | ICD-10-CM | POA: Diagnosis not present

## 2020-02-02 DIAGNOSIS — M9905 Segmental and somatic dysfunction of pelvic region: Secondary | ICD-10-CM | POA: Diagnosis not present

## 2020-02-02 DIAGNOSIS — M9904 Segmental and somatic dysfunction of sacral region: Secondary | ICD-10-CM | POA: Diagnosis not present

## 2020-02-03 ENCOUNTER — Ambulatory Visit (INDEPENDENT_AMBULATORY_CARE_PROVIDER_SITE_OTHER): Payer: BLUE CROSS/BLUE SHIELD

## 2020-02-03 DIAGNOSIS — R079 Chest pain, unspecified: Secondary | ICD-10-CM | POA: Diagnosis not present

## 2020-02-03 DIAGNOSIS — R002 Palpitations: Secondary | ICD-10-CM | POA: Diagnosis not present

## 2020-02-03 DIAGNOSIS — R0602 Shortness of breath: Secondary | ICD-10-CM

## 2020-02-04 DIAGNOSIS — M25571 Pain in right ankle and joints of right foot: Secondary | ICD-10-CM | POA: Diagnosis not present

## 2020-02-04 DIAGNOSIS — M9905 Segmental and somatic dysfunction of pelvic region: Secondary | ICD-10-CM | POA: Diagnosis not present

## 2020-02-04 DIAGNOSIS — M25572 Pain in left ankle and joints of left foot: Secondary | ICD-10-CM | POA: Diagnosis not present

## 2020-02-04 DIAGNOSIS — M9904 Segmental and somatic dysfunction of sacral region: Secondary | ICD-10-CM | POA: Diagnosis not present

## 2020-02-10 DIAGNOSIS — M9904 Segmental and somatic dysfunction of sacral region: Secondary | ICD-10-CM | POA: Diagnosis not present

## 2020-02-10 DIAGNOSIS — M25571 Pain in right ankle and joints of right foot: Secondary | ICD-10-CM | POA: Diagnosis not present

## 2020-02-10 DIAGNOSIS — M9905 Segmental and somatic dysfunction of pelvic region: Secondary | ICD-10-CM | POA: Diagnosis not present

## 2020-02-10 DIAGNOSIS — M25572 Pain in left ankle and joints of left foot: Secondary | ICD-10-CM | POA: Diagnosis not present

## 2020-02-12 DIAGNOSIS — H2513 Age-related nuclear cataract, bilateral: Secondary | ICD-10-CM | POA: Diagnosis not present

## 2020-02-12 DIAGNOSIS — M9905 Segmental and somatic dysfunction of pelvic region: Secondary | ICD-10-CM | POA: Diagnosis not present

## 2020-02-12 DIAGNOSIS — M25572 Pain in left ankle and joints of left foot: Secondary | ICD-10-CM | POA: Diagnosis not present

## 2020-02-12 DIAGNOSIS — M9904 Segmental and somatic dysfunction of sacral region: Secondary | ICD-10-CM | POA: Diagnosis not present

## 2020-02-12 DIAGNOSIS — M25571 Pain in right ankle and joints of right foot: Secondary | ICD-10-CM | POA: Diagnosis not present

## 2020-02-16 DIAGNOSIS — M25572 Pain in left ankle and joints of left foot: Secondary | ICD-10-CM | POA: Diagnosis not present

## 2020-02-16 DIAGNOSIS — M25571 Pain in right ankle and joints of right foot: Secondary | ICD-10-CM | POA: Diagnosis not present

## 2020-02-16 DIAGNOSIS — M9905 Segmental and somatic dysfunction of pelvic region: Secondary | ICD-10-CM | POA: Diagnosis not present

## 2020-02-16 DIAGNOSIS — M9904 Segmental and somatic dysfunction of sacral region: Secondary | ICD-10-CM | POA: Diagnosis not present

## 2020-02-18 DIAGNOSIS — M25572 Pain in left ankle and joints of left foot: Secondary | ICD-10-CM | POA: Diagnosis not present

## 2020-02-18 DIAGNOSIS — M25571 Pain in right ankle and joints of right foot: Secondary | ICD-10-CM | POA: Diagnosis not present

## 2020-02-18 DIAGNOSIS — M9905 Segmental and somatic dysfunction of pelvic region: Secondary | ICD-10-CM | POA: Diagnosis not present

## 2020-02-18 DIAGNOSIS — M9904 Segmental and somatic dysfunction of sacral region: Secondary | ICD-10-CM | POA: Diagnosis not present

## 2020-02-23 DIAGNOSIS — M9904 Segmental and somatic dysfunction of sacral region: Secondary | ICD-10-CM | POA: Diagnosis not present

## 2020-02-23 DIAGNOSIS — M9905 Segmental and somatic dysfunction of pelvic region: Secondary | ICD-10-CM | POA: Diagnosis not present

## 2020-02-23 DIAGNOSIS — R002 Palpitations: Secondary | ICD-10-CM | POA: Diagnosis not present

## 2020-02-23 DIAGNOSIS — R079 Chest pain, unspecified: Secondary | ICD-10-CM | POA: Diagnosis not present

## 2020-02-23 DIAGNOSIS — M25571 Pain in right ankle and joints of right foot: Secondary | ICD-10-CM | POA: Diagnosis not present

## 2020-02-23 DIAGNOSIS — R0602 Shortness of breath: Secondary | ICD-10-CM | POA: Diagnosis not present

## 2020-02-23 DIAGNOSIS — M25572 Pain in left ankle and joints of left foot: Secondary | ICD-10-CM | POA: Diagnosis not present

## 2020-02-25 DIAGNOSIS — M9904 Segmental and somatic dysfunction of sacral region: Secondary | ICD-10-CM | POA: Diagnosis not present

## 2020-02-25 DIAGNOSIS — M25571 Pain in right ankle and joints of right foot: Secondary | ICD-10-CM | POA: Diagnosis not present

## 2020-02-25 DIAGNOSIS — M25572 Pain in left ankle and joints of left foot: Secondary | ICD-10-CM | POA: Diagnosis not present

## 2020-02-25 DIAGNOSIS — M9905 Segmental and somatic dysfunction of pelvic region: Secondary | ICD-10-CM | POA: Diagnosis not present

## 2020-03-01 DIAGNOSIS — M25572 Pain in left ankle and joints of left foot: Secondary | ICD-10-CM | POA: Diagnosis not present

## 2020-03-01 DIAGNOSIS — M25571 Pain in right ankle and joints of right foot: Secondary | ICD-10-CM | POA: Diagnosis not present

## 2020-03-01 DIAGNOSIS — M9904 Segmental and somatic dysfunction of sacral region: Secondary | ICD-10-CM | POA: Diagnosis not present

## 2020-03-01 DIAGNOSIS — M9905 Segmental and somatic dysfunction of pelvic region: Secondary | ICD-10-CM | POA: Diagnosis not present

## 2020-03-08 DIAGNOSIS — M9904 Segmental and somatic dysfunction of sacral region: Secondary | ICD-10-CM | POA: Diagnosis not present

## 2020-03-08 DIAGNOSIS — M25572 Pain in left ankle and joints of left foot: Secondary | ICD-10-CM | POA: Diagnosis not present

## 2020-03-08 DIAGNOSIS — M9905 Segmental and somatic dysfunction of pelvic region: Secondary | ICD-10-CM | POA: Diagnosis not present

## 2020-03-08 DIAGNOSIS — M25571 Pain in right ankle and joints of right foot: Secondary | ICD-10-CM | POA: Diagnosis not present

## 2020-03-12 DIAGNOSIS — Z03818 Encounter for observation for suspected exposure to other biological agents ruled out: Secondary | ICD-10-CM | POA: Diagnosis not present

## 2020-03-12 DIAGNOSIS — Z20822 Contact with and (suspected) exposure to covid-19: Secondary | ICD-10-CM | POA: Diagnosis not present

## 2020-03-15 DIAGNOSIS — M9905 Segmental and somatic dysfunction of pelvic region: Secondary | ICD-10-CM | POA: Diagnosis not present

## 2020-03-15 DIAGNOSIS — Z79811 Long term (current) use of aromatase inhibitors: Secondary | ICD-10-CM | POA: Diagnosis not present

## 2020-03-15 DIAGNOSIS — M25571 Pain in right ankle and joints of right foot: Secondary | ICD-10-CM | POA: Diagnosis not present

## 2020-03-15 DIAGNOSIS — M9904 Segmental and somatic dysfunction of sacral region: Secondary | ICD-10-CM | POA: Diagnosis not present

## 2020-03-15 DIAGNOSIS — C50911 Malignant neoplasm of unspecified site of right female breast: Secondary | ICD-10-CM | POA: Diagnosis not present

## 2020-03-15 DIAGNOSIS — M25572 Pain in left ankle and joints of left foot: Secondary | ICD-10-CM | POA: Diagnosis not present

## 2020-03-22 DIAGNOSIS — M9905 Segmental and somatic dysfunction of pelvic region: Secondary | ICD-10-CM | POA: Diagnosis not present

## 2020-03-22 DIAGNOSIS — M25571 Pain in right ankle and joints of right foot: Secondary | ICD-10-CM | POA: Diagnosis not present

## 2020-03-22 DIAGNOSIS — M25572 Pain in left ankle and joints of left foot: Secondary | ICD-10-CM | POA: Diagnosis not present

## 2020-03-22 DIAGNOSIS — M9904 Segmental and somatic dysfunction of sacral region: Secondary | ICD-10-CM | POA: Diagnosis not present

## 2020-05-03 DIAGNOSIS — N10 Acute tubulo-interstitial nephritis: Secondary | ICD-10-CM | POA: Diagnosis not present

## 2020-05-03 DIAGNOSIS — N130 Hydronephrosis with ureteropelvic junction obstruction: Secondary | ICD-10-CM | POA: Diagnosis not present

## 2020-06-07 DIAGNOSIS — C50911 Malignant neoplasm of unspecified site of right female breast: Secondary | ICD-10-CM | POA: Diagnosis not present

## 2020-06-07 DIAGNOSIS — Z08 Encounter for follow-up examination after completed treatment for malignant neoplasm: Secondary | ICD-10-CM | POA: Diagnosis not present

## 2020-06-07 DIAGNOSIS — Z853 Personal history of malignant neoplasm of breast: Secondary | ICD-10-CM | POA: Diagnosis not present

## 2020-06-07 DIAGNOSIS — C50912 Malignant neoplasm of unspecified site of left female breast: Secondary | ICD-10-CM | POA: Diagnosis not present

## 2020-06-07 DIAGNOSIS — C50411 Malignant neoplasm of upper-outer quadrant of right female breast: Secondary | ICD-10-CM | POA: Diagnosis not present

## 2020-09-13 DIAGNOSIS — Z90722 Acquired absence of ovaries, bilateral: Secondary | ICD-10-CM | POA: Diagnosis not present

## 2020-09-13 DIAGNOSIS — R12 Heartburn: Secondary | ICD-10-CM | POA: Diagnosis not present

## 2020-09-13 DIAGNOSIS — Z79899 Other long term (current) drug therapy: Secondary | ICD-10-CM | POA: Diagnosis not present

## 2020-09-13 DIAGNOSIS — Z780 Asymptomatic menopausal state: Secondary | ICD-10-CM | POA: Diagnosis not present

## 2020-09-13 DIAGNOSIS — Z923 Personal history of irradiation: Secondary | ICD-10-CM | POA: Diagnosis not present

## 2020-09-13 DIAGNOSIS — E876 Hypokalemia: Secondary | ICD-10-CM | POA: Diagnosis not present

## 2020-09-13 DIAGNOSIS — R0789 Other chest pain: Secondary | ICD-10-CM | POA: Diagnosis not present

## 2020-09-13 DIAGNOSIS — Z9889 Other specified postprocedural states: Secondary | ICD-10-CM | POA: Diagnosis not present

## 2020-09-13 DIAGNOSIS — Z853 Personal history of malignant neoplasm of breast: Secondary | ICD-10-CM | POA: Diagnosis not present

## 2020-09-13 DIAGNOSIS — Z08 Encounter for follow-up examination after completed treatment for malignant neoplasm: Secondary | ICD-10-CM | POA: Diagnosis not present

## 2020-09-13 DIAGNOSIS — Z79811 Long term (current) use of aromatase inhibitors: Secondary | ICD-10-CM | POA: Diagnosis not present

## 2020-09-13 DIAGNOSIS — F419 Anxiety disorder, unspecified: Secondary | ICD-10-CM | POA: Diagnosis not present

## 2020-09-13 DIAGNOSIS — C50911 Malignant neoplasm of unspecified site of right female breast: Secondary | ICD-10-CM | POA: Diagnosis not present

## 2020-09-13 DIAGNOSIS — Z888 Allergy status to other drugs, medicaments and biological substances status: Secondary | ICD-10-CM | POA: Diagnosis not present

## 2020-09-30 DIAGNOSIS — E7800 Pure hypercholesterolemia, unspecified: Secondary | ICD-10-CM | POA: Diagnosis not present

## 2020-09-30 DIAGNOSIS — N183 Chronic kidney disease, stage 3 unspecified: Secondary | ICD-10-CM | POA: Diagnosis not present

## 2020-09-30 DIAGNOSIS — F321 Major depressive disorder, single episode, moderate: Secondary | ICD-10-CM | POA: Diagnosis not present

## 2020-09-30 DIAGNOSIS — G43009 Migraine without aura, not intractable, without status migrainosus: Secondary | ICD-10-CM | POA: Diagnosis not present

## 2020-09-30 DIAGNOSIS — K219 Gastro-esophageal reflux disease without esophagitis: Secondary | ICD-10-CM | POA: Diagnosis not present

## 2021-02-08 DIAGNOSIS — Z780 Asymptomatic menopausal state: Secondary | ICD-10-CM | POA: Diagnosis not present

## 2021-02-08 DIAGNOSIS — M8588 Other specified disorders of bone density and structure, other site: Secondary | ICD-10-CM | POA: Diagnosis not present

## 2021-02-08 DIAGNOSIS — C50911 Malignant neoplasm of unspecified site of right female breast: Secondary | ICD-10-CM | POA: Diagnosis not present

## 2021-02-14 DIAGNOSIS — Z08 Encounter for follow-up examination after completed treatment for malignant neoplasm: Secondary | ICD-10-CM | POA: Diagnosis not present

## 2021-02-14 DIAGNOSIS — M8588 Other specified disorders of bone density and structure, other site: Secondary | ICD-10-CM | POA: Diagnosis not present

## 2021-02-14 DIAGNOSIS — Z853 Personal history of malignant neoplasm of breast: Secondary | ICD-10-CM | POA: Diagnosis not present

## 2021-02-14 DIAGNOSIS — C50911 Malignant neoplasm of unspecified site of right female breast: Secondary | ICD-10-CM | POA: Diagnosis not present

## 2021-03-17 DIAGNOSIS — B349 Viral infection, unspecified: Secondary | ICD-10-CM | POA: Diagnosis not present

## 2021-03-17 DIAGNOSIS — H6501 Acute serous otitis media, right ear: Secondary | ICD-10-CM | POA: Diagnosis not present

## 2021-03-17 DIAGNOSIS — Z03818 Encounter for observation for suspected exposure to other biological agents ruled out: Secondary | ICD-10-CM | POA: Diagnosis not present

## 2021-03-17 DIAGNOSIS — R051 Acute cough: Secondary | ICD-10-CM | POA: Diagnosis not present

## 2021-03-30 DIAGNOSIS — E7800 Pure hypercholesterolemia, unspecified: Secondary | ICD-10-CM | POA: Diagnosis not present

## 2021-03-30 DIAGNOSIS — G43009 Migraine without aura, not intractable, without status migrainosus: Secondary | ICD-10-CM | POA: Diagnosis not present

## 2021-03-30 DIAGNOSIS — F321 Major depressive disorder, single episode, moderate: Secondary | ICD-10-CM | POA: Diagnosis not present

## 2021-03-30 DIAGNOSIS — K219 Gastro-esophageal reflux disease without esophagitis: Secondary | ICD-10-CM | POA: Diagnosis not present

## 2021-05-16 DIAGNOSIS — L570 Actinic keratosis: Secondary | ICD-10-CM | POA: Diagnosis not present

## 2021-05-16 DIAGNOSIS — D485 Neoplasm of uncertain behavior of skin: Secondary | ICD-10-CM | POA: Diagnosis not present

## 2021-05-16 DIAGNOSIS — L814 Other melanin hyperpigmentation: Secondary | ICD-10-CM | POA: Diagnosis not present

## 2021-06-07 DIAGNOSIS — F5104 Psychophysiologic insomnia: Secondary | ICD-10-CM | POA: Diagnosis not present

## 2021-06-07 DIAGNOSIS — G4719 Other hypersomnia: Secondary | ICD-10-CM | POA: Diagnosis not present

## 2021-06-13 DIAGNOSIS — Z923 Personal history of irradiation: Secondary | ICD-10-CM | POA: Diagnosis not present

## 2021-06-13 DIAGNOSIS — Z9889 Other specified postprocedural states: Secondary | ICD-10-CM | POA: Diagnosis not present

## 2021-06-13 DIAGNOSIS — C50911 Malignant neoplasm of unspecified site of right female breast: Secondary | ICD-10-CM | POA: Diagnosis not present

## 2021-06-13 DIAGNOSIS — N6321 Unspecified lump in the left breast, upper outer quadrant: Secondary | ICD-10-CM | POA: Diagnosis not present

## 2021-06-13 DIAGNOSIS — Z170 Estrogen receptor positive status [ER+]: Secondary | ICD-10-CM | POA: Diagnosis not present

## 2021-06-13 DIAGNOSIS — C50411 Malignant neoplasm of upper-outer quadrant of right female breast: Secondary | ICD-10-CM | POA: Diagnosis not present

## 2021-06-13 DIAGNOSIS — N6312 Unspecified lump in the right breast, upper inner quadrant: Secondary | ICD-10-CM | POA: Diagnosis not present

## 2021-06-15 DIAGNOSIS — N6310 Unspecified lump in the right breast, unspecified quadrant: Secondary | ICD-10-CM | POA: Diagnosis not present

## 2021-06-15 DIAGNOSIS — R928 Other abnormal and inconclusive findings on diagnostic imaging of breast: Secondary | ICD-10-CM | POA: Diagnosis not present

## 2021-06-15 DIAGNOSIS — N6011 Diffuse cystic mastopathy of right breast: Secondary | ICD-10-CM | POA: Diagnosis not present

## 2021-06-15 DIAGNOSIS — N6021 Fibroadenosis of right breast: Secondary | ICD-10-CM | POA: Diagnosis not present

## 2021-06-16 DIAGNOSIS — L814 Other melanin hyperpigmentation: Secondary | ICD-10-CM | POA: Diagnosis not present

## 2021-06-16 DIAGNOSIS — D2372 Other benign neoplasm of skin of left lower limb, including hip: Secondary | ICD-10-CM | POA: Diagnosis not present

## 2021-06-16 DIAGNOSIS — L821 Other seborrheic keratosis: Secondary | ICD-10-CM | POA: Diagnosis not present

## 2021-06-16 DIAGNOSIS — D2371 Other benign neoplasm of skin of right lower limb, including hip: Secondary | ICD-10-CM | POA: Diagnosis not present

## 2021-06-21 DIAGNOSIS — R928 Other abnormal and inconclusive findings on diagnostic imaging of breast: Secondary | ICD-10-CM | POA: Diagnosis not present

## 2021-06-21 DIAGNOSIS — N61 Mastitis without abscess: Secondary | ICD-10-CM | POA: Diagnosis not present

## 2021-06-21 DIAGNOSIS — N641 Fat necrosis of breast: Secondary | ICD-10-CM | POA: Diagnosis not present

## 2021-06-21 DIAGNOSIS — G4733 Obstructive sleep apnea (adult) (pediatric): Secondary | ICD-10-CM | POA: Diagnosis not present

## 2021-06-21 DIAGNOSIS — R921 Mammographic calcification found on diagnostic imaging of breast: Secondary | ICD-10-CM | POA: Diagnosis not present

## 2021-06-21 DIAGNOSIS — N6031 Fibrosclerosis of right breast: Secondary | ICD-10-CM | POA: Diagnosis not present

## 2021-09-07 ENCOUNTER — Other Ambulatory Visit: Payer: Self-pay

## 2021-09-07 ENCOUNTER — Emergency Department (HOSPITAL_BASED_OUTPATIENT_CLINIC_OR_DEPARTMENT_OTHER)
Admission: EM | Admit: 2021-09-07 | Discharge: 2021-09-07 | Disposition: A | Discharge status: Home / Self Care | Payer: BC Managed Care – PPO | Attending: Emergency Medicine | Admitting: Emergency Medicine

## 2021-09-07 ENCOUNTER — Emergency Department (HOSPITAL_BASED_OUTPATIENT_CLINIC_OR_DEPARTMENT_OTHER): Payer: BC Managed Care – PPO

## 2021-09-07 ENCOUNTER — Encounter (HOSPITAL_BASED_OUTPATIENT_CLINIC_OR_DEPARTMENT_OTHER): Payer: Self-pay

## 2021-09-07 DIAGNOSIS — N3289 Other specified disorders of bladder: Secondary | ICD-10-CM | POA: Diagnosis not present

## 2021-09-07 DIAGNOSIS — N12 Tubulo-interstitial nephritis, not specified as acute or chronic: Secondary | ICD-10-CM | POA: Insufficient documentation

## 2021-09-07 DIAGNOSIS — N1330 Unspecified hydronephrosis: Secondary | ICD-10-CM | POA: Diagnosis not present

## 2021-09-07 DIAGNOSIS — Z923 Personal history of irradiation: Secondary | ICD-10-CM | POA: Diagnosis not present

## 2021-09-07 DIAGNOSIS — R109 Unspecified abdominal pain: Secondary | ICD-10-CM | POA: Diagnosis not present

## 2021-09-07 DIAGNOSIS — C50912 Malignant neoplasm of unspecified site of left female breast: Secondary | ICD-10-CM | POA: Diagnosis not present

## 2021-09-07 DIAGNOSIS — Z853 Personal history of malignant neoplasm of breast: Secondary | ICD-10-CM | POA: Insufficient documentation

## 2021-09-07 DIAGNOSIS — K7689 Other specified diseases of liver: Secondary | ICD-10-CM | POA: Diagnosis not present

## 2021-09-07 DIAGNOSIS — M545 Low back pain, unspecified: Secondary | ICD-10-CM | POA: Diagnosis not present

## 2021-09-07 LAB — URINALYSIS, ROUTINE W REFLEX MICROSCOPIC
Bilirubin Urine: NEGATIVE
Glucose, UA: NEGATIVE mg/dL
Ketones, ur: NEGATIVE mg/dL
Nitrite: NEGATIVE
Protein, ur: 100 mg/dL — AB
RBC / HPF: >50 RBC/hpf — ABNORMAL HIGH (ref 0–5)
Specific Gravity, Urine: 1.014 (ref 1.005–1.030)
WBC, UA: >50 WBC/hpf — ABNORMAL HIGH (ref 0–5)
pH: 6.5 (ref 5.0–8.0)

## 2021-09-07 LAB — WET PREP, GENITAL
Clue Cells Wet Prep HPF POC: NONE SEEN
Sperm: NONE SEEN
Trich, Wet Prep: NONE SEEN
WBC, Wet Prep HPF POC: <10 (ref <10)
Yeast Wet Prep HPF POC: NONE SEEN

## 2021-09-07 MED ORDER — FLUCONAZOLE 150 MG PO TABS
150.0000 mg | ORAL_TABLET | Freq: Once | ORAL | Status: AC
  Administered 2021-09-07: 150 mg via ORAL
  Filled 2021-09-07: qty 1

## 2021-09-07 MED ORDER — LEVOFLOXACIN 750 MG PO TABS: 750.0000 mg | ORAL_TABLET | Freq: Every day | ORAL | 0 refills | Status: AC

## 2021-09-07 MED ORDER — FLUCONAZOLE 150 MG PO TABS: ORAL_TABLET | ORAL | 0 refills | Status: AC

## 2021-09-07 NOTE — ED Notes (Signed)
Patient transported to CT via stretcher with tech

## 2021-09-07 NOTE — ED Triage Notes (Signed)
Bilateral lower back pain that started this past Monday while lifting weights. Denies any urinary symptoms. No numbness or tingling in extremities.

## 2021-09-07 NOTE — ED Provider Notes (Signed)
[facility] Provider Note: Georgena Spurling, MD, FACEP  CSN: [id number] MRN: [id number] ARRIVAL: 09/07/21 at [place]: [place]  09/07/21 5:58 AM Amanda Martinez is a 56 y.o. female with sharp, bilateral low back pain that began 2 days ago.  She attributes it to lifting weights.  The pain is 6 out of 10 and worse with movement.  She has had no associated numbness or weakness.  In addition to this pain she has had an intermittent, colicky left flank pain that is not worse with movement or palpation.  She denies hematuria or dysuria but has had increased frequency of urination.   Past Medical History:  Diagnosis Date   Cancer [facility])    breast    Depression    Personal history of radiation therapy     Past Surgical History:  Procedure Laterality Date   BREAST LUMPECTOMY     FINGER SURGERY Right    little finger   KNEE SURGERY Right    SALPINGOOPHORECTOMY      No family history on file.  Social History   Tobacco Use   Smoking status: Never   Smokeless tobacco: Never  Substance Use Topics   Alcohol use: Never   Drug use: Never    Prior to Admission medications   Medication Sig Start Date End Date Taking? Authorizing Provider  fluconazole (DIFLUCAN) 150 MG tablet Take 1 tablet as needed for vaginal yeast infection.  May repeat in 3 days if symptoms persist. 09/07/21  Yes Yanin Muhlestein, Jenny Reichmann, MD  levofloxacin (LEVAQUIN) 750 MG tablet Take 1 tablet (750 mg total) by mouth daily. 09/07/21  Yes Betta Balla, MD  acetaminophen (TYLENOL) 500 MG tablet Take 500 mg by mouth every 6 (six) hours as needed for headache.    [provider]  ALFALFA PO Take 5 tablets by mouth 2 (two) times daily.    [provider]  Alpha-Lipoic Acid 200 MG CAPS Take 1 capsule by mouth 3 (three) times daily.    [provider]  anastrozole (ARIMIDEX) 1 MG tablet Take 1 mg by mouth daily.  11/14/17   [provider]  Ascorbic Acid (VITAMIN C PO) Take 1 tablet by mouth 3 (three) times daily. Vitamin C 2 (Sustained Release)    [provider]  b complex vitamins tablet Take 3-4 tablets by mouth 2 (two) times daily. Take 4 tables midday and Take 3 tablets in the evening.    [provider]  BLACK COHOSH PO Take 1 capsule by mouth 2 (two) times daily.    [provider]  CALCIUM PO Take 3 tablets by mouth 2 (two) times daily. Osteomatrix    [provider]  cholecalciferol (VITAMIN D) 1000 units tablet Take 1,000-2,000 Units by mouth 3 (three) times daily. Take 1 tablet in the morning, Take 1 tablet at midday and Take 2 tablets in the evening.    [provider]  citalopram (CELEXA) 40 MG tablet Take 40 mg by mouth daily.  11/20/17   [provider]  EQL EVENING PRIMROSE OIL PO Take 1,300 mg by mouth 2 (two) times daily.     [provider]  GARLIC PO Take 1 tablet by mouth 2 (two) times daily.    [provider]  ibuprofen (ADVIL,MOTRIN) 200 MG tablet Take 200 mg by mouth every 6 (six) hours as needed for headache.    [provider]  Iron-Vit C-Vit  B12-Folic Acid (IRON 742 PLUS PO) Take 1-2 tablets by mouth 2 (two) times daily. Iron Plus. Take 2 tablets at midday and Take 1 tablet in the evening.    [provider]  LECITHIN PO Take 3 capsules by mouth daily.    [provider]  Melatonin 10 MG TABS Take 1 tablet by mouth at bedtime as needed (sleep).    [provider]  Misc Natural Products (JOINT HEALTH) CAPS Take 1 capsule by mouth 3 (three) times daily.    [provider]  Multiple Vitamins-Minerals (ZINC PO) Take 1 tablet by mouth 2 (two) times daily.    [provider]  Omega-3 1000 MG CAPS Take 6,000 mg by mouth daily.    [provider]  OVER THE COUNTER MEDICATION Take 1 capsule by mouth 2 (two) times daily. CoQHeart    [provider]  VITAMIN E PO Take 2  capsules by mouth 2 (two) times daily.    [provider]    Allergies Compazine [prochlorperazine], Prilosec [omeprazole], and Tramadol   REVIEW OF SYSTEMS  Negative except as noted here or in the History of Present Illness.   PHYSICAL EXAMINATION  Initial Vital Signs Blood pressure 108/78, pulse 84, temperature 98 F (36.7 C), temperature source Oral, resp. rate 16, height 5\' 9"  (1.753 m), weight 88.9 kg, SpO2 100 %.  Examination General: Well-developed, well-nourished female in no acute distress; appearance consistent with age of record HENT: normocephalic; atraumatic Eyes: Normal appearance Neck: supple Heart: regular rate and rhythm Lungs: clear to auscultation bilaterally Abdomen: soft; nondistended; nontender; bowel sounds present GU: No CVA tenderness; urine cloudy Back: Left paralumbar tenderness; pain on movement of lower back Extremities: No deformity; full range of motion; pulses normal Neurologic: Awake, alert and oriented; motor function intact in all extremities and symmetric; no facial droop Skin: Warm and dry Psychiatric: Flat affect   RESULTS  Summary of this visit's results, reviewed and interpreted by myself:   EKG Interpretation  Date/Time:    Ventricular Rate:    PR Interval:    QRS Duration:   QT Interval:    QTC Calculation:   R Axis:     Text Interpretation:         Laboratory Studies: Results for orders placed or performed during the hospital encounter of 09/07/21 (from the past 24 hour(s))  Urinalysis, Routine w reflex microscopic     Status: Abnormal   Collection Time: 09/07/21  6:08 AM  Result Value Ref Range   Color, Urine YELLOW YELLOW   APPearance HAZY (A) CLEAR   Specific Gravity, Urine 1.014 1.005 - 1.030   pH 6.5 5.0 - 8.0   Glucose, UA NEGATIVE NEGATIVE mg/dL   Hgb urine dipstick MODERATE (A) NEGATIVE   Bilirubin Urine NEGATIVE NEGATIVE   Ketones, ur NEGATIVE NEGATIVE mg/dL   Protein, ur 100 (A) NEGATIVE mg/dL    Nitrite NEGATIVE NEGATIVE   Leukocytes,Ua LARGE (A) NEGATIVE   RBC / HPF >50 (H) 0 - 5 RBC/hpf   WBC, UA >50 (H) 0 - 5 WBC/hpf   Bacteria, UA RARE (A) NONE SEEN   Squamous Epithelial / LPF 0-5 0 - 5   WBC Clumps PRESENT    Mucus PRESENT    Budding Yeast PRESENT    Non Squamous Epithelial 0-5 (A) NONE SEEN   Crystals PRESENT (A) NEGATIVE  Wet prep, genital     Status: None   Collection Time: 09/07/21  6:54 AM  Result Value Ref Range  Yeast Wet Prep HPF POC NONE SEEN NONE SEEN   Trich, Wet Prep NONE SEEN NONE SEEN   Clue Cells Wet Prep HPF POC NONE SEEN NONE SEEN   WBC, Wet Prep HPF POC <10 <10   Sperm NONE SEEN    Imaging Studies: CT Renal Stone Study  Result Date: 09/07/2021 CLINICAL DATA:  Bilateral flank pain.  Kidney stones suspected. EXAM: CT ABDOMEN AND PELVIS WITHOUT CONTRAST TECHNIQUE: Multidetector CT imaging of the abdomen and pelvis was performed following the standard protocol without IV contrast. RADIATION DOSE REDUCTION: This exam was performed according to the departmental dose-optimization program which includes automated exposure control, adjustment of the mA and/or kV according to patient size and/or use of iterative reconstruction technique. COMPARISON:  12/25/2017 FINDINGS: Lower chest: Unremarkable. Hepatobiliary: 8 mm hypodensity posterior segment IV is unchanged in the interval consistent with benign etiology such as a cyst. 9 mm subcapsular hypodensity medial right liver on 14/2 is also unchanged. There is no evidence for gallstones, gallbladder wall thickening, or pericholecystic fluid. No intrahepatic or extrahepatic biliary dilation. Pancreas: No focal mass lesion. No dilatation of the main duct. No intraparenchymal cyst. No peripancreatic edema. Spleen: No splenomegaly. No focal mass lesion. Adrenals/Urinary Tract: No adrenal nodule or mass. Horseshoe kidney anatomy noted. No evidence for nephrolithiasis. Right ureter unremarkable. There is mild hydronephrosis in  the left renal moiety, slightly progressive compared to the prior study with subtle edema around the left renal pelvis. Mild fullness noted in the left ureter but no left ureteral stone can be identified. Bladder is decompressed. Stomach/Bowel: Stomach is unremarkable. No gastric wall thickening. No evidence of outlet obstruction. Duodenum is normally positioned as is the ligament of Treitz. No small bowel wall thickening. No small bowel dilatation. The terminal ileum is normal. Small appendicoliths noted without appendiceal dilatation or periappendiceal edema/inflammation. No gross colonic mass. No colonic wall thickening. Vascular/Lymphatic: No abdominal aortic aneurysm. There is no gastrohepatic or hepatoduodenal ligament lymphadenopathy. No retroperitoneal or mesenteric lymphadenopathy. No pelvic sidewall lymphadenopathy. Reproductive: Unremarkable. Other: No intraperitoneal free fluid. Musculoskeletal: No worrisome lytic or sclerotic osseous abnormality. IMPRESSION: 1. Horseshoe kidney anatomy without nephrolithiasis or evidence of ureteral/bladder stones. Mild hydronephrosis in the left renal moiety, slightly progressive compared to the prior study is associated with subtle edema around the left renal pelvis and proximal left ureter. No definite etiology for these changes on today's noncontrast study. Imaging features may be related to a recently passed stone or renal/collecting system infection/inflammation. Follow-up likely warranted to ensure resolution as underlying urothelial lesion cannot be excluded. 2. Stable appearance of small hepatic cysts. Electronically Signed   By: Misty Stanley M.D.   On: 09/07/2021 06:41    ED COURSE and MDM  Nursing notes, initial and subsequent vitals signs, including pulse oximetry, reviewed and interpreted by myself.  Vitals:   09/07/21 0554 09/07/21 0555  BP: 108/78   Pulse: 84   Resp: 16   Temp: 98 F (36.7 C)   TempSrc: Oral   SpO2: 100%   Weight:  88.9 kg   Height:  5\' 9"  (1.753 m)   Medications  fluconazole (DIFLUCAN) tablet 150 mg (150 mg Oral Given 09/07/21 1025)    6:55 AM The patient's urinalysis is concerning for urinary tract infection (pyelonephritis given flank pain) but the possibility of a recently passed stone cannot be excluded given the amount of blood.  Urine has been sent for culture and we will treat with a course of antibiotics for possible pyelonephritis.  There is no  evidence of bony metastatic disease causing her low back pain which is reassuring given her history of breast cancer.  PROCEDURES  Procedures   ED DIAGNOSES     ICD-10-CM   1. Acute bilateral low back pain without sciatica  M54.50     2. Pyelonephritis  N12          Kandie Keiper, MD 09/07/21 [phone]

## 2021-09-09 LAB — URINE CULTURE: Culture: >=100000 — AB

## 2021-09-10 ENCOUNTER — Telehealth: Payer: Self-pay | Admitting: Emergency Medicine

## 2021-09-10 NOTE — Telephone Encounter (Signed)
Post ED Visit - Positive Culture Follow-up: Successful Patient Follow-Up  Culture assessed and recommendations reviewed by:  []  Elenor Quinones, Pharm.D. []  Heide Guile, Pharm.D., BCPS AQ-ID []  Parks Neptune, Pharm.D., BCPS []  Alycia Rossetti, Pharm.D., BCPS []  [place], [place].D., BCPS, AAHIVP []  Legrand Como, Pharm.D., BCPS, AAHIVP []  Salome Arnt, PharmD, BCPS []  Johnnette Gourd, PharmD, BCPS []  Hughes Better, PharmD, BCPS [x]  Lucio Edward, PharmD  Positive urine culture  []  Patient discharged without antimicrobial prescription and treatment is now indicated [x]  Organism is resistant to prescribed ED discharge antimicrobial []  Patient with positive blood cultures  Changes discussed with ED provider: Nuala Alpha PA New antibiotic prescription Cephalexin 500 mg PO twice daily for seven days Called to [place]  Contacted patient, date 09/10/2021, time [place] 09/10/2021, 12:35 PM

## 2021-09-10 NOTE — Progress Notes (Signed)
ED Antimicrobial Stewardship Positive Culture Follow Up   Amanda Martinez is an 56 y.o. female who presented to [facility] on 09/07/2021 with a chief complaint of  Chief Complaint  Patient presents with   Back Pain    Recent Results (from the past 720 hour(s))  Urine Culture     Status: Abnormal   Collection Time: 09/07/21  6:08 AM   Specimen: Urine, Clean Catch  Result Value Ref Range Status   Specimen Description   Final    URINE, CLEAN CATCH Performed at [facility], [address] [place], [place] [address]    Special Requests   Final    NONE Performed at [facility], [address], [place], [place] [address]    Culture >=100,000 COLONIES/mL ESCHERICHIA COLI (A)  Final   Report Status 09/09/2021 FINAL  Final   Organism ID, Bacteria ESCHERICHIA COLI (A)  Final      Susceptibility   Escherichia coli - MIC*    AMPICILLIN <=2 SENSITIVE Sensitive     CEFAZOLIN <=4 SENSITIVE Sensitive     CEFEPIME <=0.12 SENSITIVE Sensitive     CEFTRIAXONE <=0.25 SENSITIVE Sensitive     CIPROFLOXACIN >=4 RESISTANT Resistant     GENTAMICIN <=1 SENSITIVE Sensitive     IMIPENEM <=0.25 SENSITIVE Sensitive     NITROFURANTOIN <=16 SENSITIVE Sensitive     TRIMETH/SULFA <=20 SENSITIVE Sensitive     AMPICILLIN/SULBACTAM <=2 SENSITIVE Sensitive     PIP/TAZO <=4 SENSITIVE Sensitive     * >=100,000 COLONIES/mL ESCHERICHIA COLI  Wet prep, genital     Status: None   Collection Time: 09/07/21  6:54 AM  Result Value Ref Range Status   Yeast Wet Prep HPF POC NONE SEEN NONE SEEN Final   Trich, Wet Prep NONE SEEN NONE SEEN Final   Clue Cells Wet Prep HPF POC NONE SEEN NONE SEEN Final   WBC, Wet Prep HPF POC <10 <10 Final   Sperm NONE SEEN  Final    Comment: Performed at [facility], [address] [place], [place] [address]    [x]  Treated with Levaquin, organism resistant to prescribed antimicrobial []  Patient discharged originally without  antimicrobial agent and treatment is now indicated  New antibiotic prescription: Cephalexin 500 mg twice daily x 7 days   ED Provider: Nuala Alpha, PA-C    Albertina Parr, PharmD., BCCCP Clinical Pharmacist Please refer to [facility] for unit-specific pharmacist

## 2021-09-13 DIAGNOSIS — N130 Hydronephrosis with ureteropelvic junction obstruction: Secondary | ICD-10-CM | POA: Diagnosis not present

## 2021-09-13 DIAGNOSIS — R8271 Bacteriuria: Secondary | ICD-10-CM | POA: Diagnosis not present

## 2021-09-13 DIAGNOSIS — N10 Acute tubulo-interstitial nephritis: Secondary | ICD-10-CM | POA: Diagnosis not present

## 2021-09-15 DIAGNOSIS — Z9889 Other specified postprocedural states: Secondary | ICD-10-CM | POA: Diagnosis not present

## 2021-09-15 DIAGNOSIS — Z923 Personal history of irradiation: Secondary | ICD-10-CM | POA: Diagnosis not present

## 2021-09-15 DIAGNOSIS — C50911 Malignant neoplasm of unspecified site of right female breast: Secondary | ICD-10-CM | POA: Diagnosis not present

## 2021-09-29 DIAGNOSIS — N1330 Unspecified hydronephrosis: Secondary | ICD-10-CM | POA: Diagnosis not present

## 2021-09-29 DIAGNOSIS — Q631 Lobulated, fused and horseshoe kidney: Secondary | ICD-10-CM | POA: Diagnosis not present

## 2021-09-29 DIAGNOSIS — C50911 Malignant neoplasm of unspecified site of right female breast: Secondary | ICD-10-CM | POA: Diagnosis not present

## 2021-09-29 DIAGNOSIS — Z01818 Encounter for other preprocedural examination: Secondary | ICD-10-CM | POA: Diagnosis not present

## 2021-10-27 DIAGNOSIS — Z9889 Other specified postprocedural states: Secondary | ICD-10-CM | POA: Diagnosis not present

## 2021-11-03 DIAGNOSIS — G43909 Migraine, unspecified, not intractable, without status migrainosus: Secondary | ICD-10-CM | POA: Diagnosis not present

## 2021-11-03 DIAGNOSIS — Z853 Personal history of malignant neoplasm of breast: Secondary | ICD-10-CM | POA: Diagnosis not present

## 2021-11-03 DIAGNOSIS — C50411 Malignant neoplasm of upper-outer quadrant of right female breast: Secondary | ICD-10-CM | POA: Diagnosis not present

## 2021-11-03 DIAGNOSIS — E785 Hyperlipidemia, unspecified: Secondary | ICD-10-CM | POA: Diagnosis not present

## 2021-11-03 DIAGNOSIS — N189 Chronic kidney disease, unspecified: Secondary | ICD-10-CM | POA: Diagnosis not present

## 2021-11-03 DIAGNOSIS — E876 Hypokalemia: Secondary | ICD-10-CM | POA: Diagnosis not present

## 2021-11-09 ENCOUNTER — Other Ambulatory Visit: Payer: Self-pay | Admitting: Adult Health

## 2021-11-09 DIAGNOSIS — R310 Gross hematuria: Secondary | ICD-10-CM

## 2021-11-09 DIAGNOSIS — H2513 Age-related nuclear cataract, bilateral: Secondary | ICD-10-CM | POA: Diagnosis not present

## 2021-11-09 DIAGNOSIS — H43813 Vitreous degeneration, bilateral: Secondary | ICD-10-CM | POA: Diagnosis not present

## 2021-11-09 DIAGNOSIS — N1330 Unspecified hydronephrosis: Secondary | ICD-10-CM

## 2021-11-09 DIAGNOSIS — H5213 Myopia, bilateral: Secondary | ICD-10-CM | POA: Diagnosis not present

## 2021-11-09 DIAGNOSIS — H52203 Unspecified astigmatism, bilateral: Secondary | ICD-10-CM | POA: Diagnosis not present

## 2021-11-22 DIAGNOSIS — E876 Hypokalemia: Secondary | ICD-10-CM | POA: Diagnosis not present

## 2021-11-22 DIAGNOSIS — N6042 Mammary duct ectasia of left breast: Secondary | ICD-10-CM | POA: Diagnosis not present

## 2021-11-22 DIAGNOSIS — C50912 Malignant neoplasm of unspecified site of left female breast: Secondary | ICD-10-CM | POA: Diagnosis not present

## 2021-11-22 DIAGNOSIS — G8918 Other acute postprocedural pain: Secondary | ICD-10-CM | POA: Diagnosis not present

## 2021-11-22 DIAGNOSIS — N189 Chronic kidney disease, unspecified: Secondary | ICD-10-CM | POA: Diagnosis not present

## 2021-11-22 DIAGNOSIS — C50911 Malignant neoplasm of unspecified site of right female breast: Secondary | ICD-10-CM | POA: Diagnosis not present

## 2021-11-22 DIAGNOSIS — E785 Hyperlipidemia, unspecified: Secondary | ICD-10-CM | POA: Diagnosis not present

## 2021-11-22 DIAGNOSIS — N6012 Diffuse cystic mastopathy of left breast: Secondary | ICD-10-CM | POA: Diagnosis not present

## 2021-11-22 DIAGNOSIS — G43909 Migraine, unspecified, not intractable, without status migrainosus: Secondary | ICD-10-CM | POA: Diagnosis not present

## 2021-11-22 DIAGNOSIS — N6011 Diffuse cystic mastopathy of right breast: Secondary | ICD-10-CM | POA: Diagnosis not present

## 2021-11-22 DIAGNOSIS — Z853 Personal history of malignant neoplasm of breast: Secondary | ICD-10-CM | POA: Diagnosis not present

## 2021-11-22 DIAGNOSIS — C50411 Malignant neoplasm of upper-outer quadrant of right female breast: Secondary | ICD-10-CM | POA: Diagnosis not present

## 2021-11-22 DIAGNOSIS — Z4001 Encounter for prophylactic removal of breast: Secondary | ICD-10-CM | POA: Diagnosis not present

## 2021-11-22 DIAGNOSIS — N62 Hypertrophy of breast: Secondary | ICD-10-CM | POA: Diagnosis not present

## 2021-11-23 DIAGNOSIS — Z853 Personal history of malignant neoplasm of breast: Secondary | ICD-10-CM | POA: Diagnosis not present

## 2021-11-23 DIAGNOSIS — N189 Chronic kidney disease, unspecified: Secondary | ICD-10-CM | POA: Diagnosis not present

## 2021-11-23 DIAGNOSIS — G43909 Migraine, unspecified, not intractable, without status migrainosus: Secondary | ICD-10-CM | POA: Diagnosis not present

## 2021-11-23 DIAGNOSIS — E876 Hypokalemia: Secondary | ICD-10-CM | POA: Diagnosis not present

## 2021-11-23 DIAGNOSIS — E785 Hyperlipidemia, unspecified: Secondary | ICD-10-CM | POA: Diagnosis not present

## 2021-11-23 DIAGNOSIS — C50411 Malignant neoplasm of upper-outer quadrant of right female breast: Secondary | ICD-10-CM | POA: Diagnosis not present

## 2021-11-27 ENCOUNTER — Encounter (HOSPITAL_BASED_OUTPATIENT_CLINIC_OR_DEPARTMENT_OTHER): Payer: Self-pay | Admitting: Emergency Medicine

## 2021-11-27 ENCOUNTER — Emergency Department (HOSPITAL_BASED_OUTPATIENT_CLINIC_OR_DEPARTMENT_OTHER): Payer: BC Managed Care – PPO

## 2021-11-27 ENCOUNTER — Other Ambulatory Visit: Payer: Self-pay

## 2021-11-27 ENCOUNTER — Emergency Department (HOSPITAL_BASED_OUTPATIENT_CLINIC_OR_DEPARTMENT_OTHER)
Admission: EM | Admit: 2021-11-27 | Discharge: 2021-11-27 | Disposition: A | Discharge status: Home / Self Care | Payer: BC Managed Care – PPO | Attending: Emergency Medicine | Admitting: Emergency Medicine

## 2021-11-27 DIAGNOSIS — Z853 Personal history of malignant neoplasm of breast: Secondary | ICD-10-CM | POA: Insufficient documentation

## 2021-11-27 DIAGNOSIS — L03313 Cellulitis of chest wall: Secondary | ICD-10-CM | POA: Diagnosis not present

## 2021-11-27 DIAGNOSIS — D72829 Elevated white blood cell count, unspecified: Secondary | ICD-10-CM | POA: Insufficient documentation

## 2021-11-27 DIAGNOSIS — R222 Localized swelling, mass and lump, trunk: Secondary | ICD-10-CM | POA: Diagnosis not present

## 2021-11-27 DIAGNOSIS — R739 Hyperglycemia, unspecified: Secondary | ICD-10-CM | POA: Diagnosis not present

## 2021-11-27 DIAGNOSIS — L538 Other specified erythematous conditions: Secondary | ICD-10-CM | POA: Diagnosis not present

## 2021-11-27 HISTORY — DX: Disorder of kidney and ureter, unspecified: N28.9

## 2021-11-27 LAB — CBC WITH DIFFERENTIAL/PLATELET
Abs Immature Granulocytes: 0.02 10*3/uL (ref 0.00–0.07)
Basophils Absolute: 0 10*3/uL (ref 0.0–0.1)
Basophils Relative: 0 %
Eosinophils Absolute: 0.4 10*3/uL (ref 0.0–0.5)
Eosinophils Relative: 4 %
HCT: 38.9 % (ref 36.0–46.0)
Hemoglobin: 13.5 g/dL (ref 12.0–15.0)
Immature Granulocytes: 0 %
Lymphocytes Relative: 13 %
Lymphs Abs: 1.4 10*3/uL (ref 0.7–4.0)
MCH: 31.8 pg (ref 26.0–34.0)
MCHC: 34.7 g/dL (ref 30.0–36.0)
MCV: 91.7 fL (ref 80.0–100.0)
Monocytes Absolute: 0.8 10*3/uL (ref 0.1–1.0)
Monocytes Relative: 8 %
Neutro Abs: 8.2 10*3/uL — ABNORMAL HIGH (ref 1.7–7.7)
Neutrophils Relative %: 75 %
Platelets: 243 10*3/uL (ref 150–400)
RBC: 4.24 MIL/uL (ref 3.87–5.11)
RDW: 12.3 % (ref 11.5–15.5)
WBC: 10.8 10*3/uL — ABNORMAL HIGH (ref 4.0–10.5)
nRBC: 0 % (ref 0.0–0.2)

## 2021-11-27 LAB — COMPREHENSIVE METABOLIC PANEL
ALT: 77 U/L — ABNORMAL HIGH (ref 0–44)
AST: 40 U/L (ref 15–41)
Albumin: 4.2 g/dL (ref 3.5–5.0)
Alkaline Phosphatase: 96 U/L (ref 38–126)
Anion gap: 9 (ref 5–15)
BUN: 19 mg/dL (ref 6–20)
CO2: 26 mmol/L (ref 22–32)
Calcium: 9.2 mg/dL (ref 8.9–10.3)
Chloride: 104 mmol/L (ref 98–111)
Creatinine, Ser: 0.8 mg/dL (ref 0.44–1.00)
GFR, Estimated: >60 mL/min (ref >60)
Glucose, Bld: 113 mg/dL — ABNORMAL HIGH (ref 70–99)
Potassium: 3.3 mmol/L — ABNORMAL LOW (ref 3.5–5.1)
Sodium: 139 mmol/L (ref 135–145)
Total Bilirubin: 0.4 mg/dL (ref 0.3–1.2)
Total Protein: 7.1 g/dL (ref 6.5–8.1)

## 2021-11-27 LAB — PROTIME-INR
INR: 1 (ref 0.8–1.2)
Prothrombin Time: 12.5 seconds (ref 11.4–15.2)

## 2021-11-27 LAB — LACTIC ACID, PLASMA
Lactic Acid, Venous: 1.5 mmol/L (ref 0.5–1.9)
Lactic Acid, Venous: 1.3 mmol/L (ref 0.5–1.9)

## 2021-11-27 MED ORDER — CEPHALEXIN 250 MG PO CAPS
500.0000 mg | ORAL_CAPSULE | Freq: Once | ORAL | Status: AC
  Administered 2021-11-27: 500 mg via ORAL
  Filled 2021-11-27: qty 2

## 2021-11-27 MED ORDER — CEPHALEXIN 500 MG PO CAPS
500.0000 mg | ORAL_CAPSULE | Freq: Four times a day (QID) | ORAL | 0 refills | Status: AC
Start: 2021-11-27 — End: ?

## 2021-11-27 MED ORDER — HYDROCODONE-ACETAMINOPHEN 5-325 MG PO TABS
1.0000 | ORAL_TABLET | Freq: Once | ORAL | Status: AC
  Administered 2021-11-27: 1 via ORAL
  Filled 2021-11-27: qty 1

## 2021-11-27 NOTE — ED Notes (Signed)
Discharge paperwork given and understood. 

## 2021-11-27 NOTE — ED Provider Notes (Signed)
?Amanda Martinez ?Provider Note ? ? ?CSN: [id number] ?Arrival date & time: 11/27/21  1340 ? ?  ? ?History ? ?Chief Complaint  ?Patient presents with  ? Breast Discharge  ? ? ?Amanda Martinez is a 56 y.o. female with a past medical history of breast cancer who presents to the emergency department with concerns for left chest wall with redness and drainage onset today.  Patient notes she had a bilateral mastectomy on 11/22/2021 completed by Dr. Clovis Riley through [facility].  She denies any pain, redness, drainage until today.  Has been taking Tylenol with her last dose being 2 days ago.  Was just evaluated by her surgeon Dr. Clovis Riley 2 days before the start of her symptoms.  Denies fever, chills, chest pain, shortness of breath. ? ? ?The history is provided by the patient. No language interpreter was used.  ? ?  ? ?Home Medications ?Prior to Admission medications   ?Medication Sig Start Date End Date Taking? Authorizing Provider  ?cephALEXin (KEFLEX) 500 MG capsule Take 1 capsule (500 mg total) by mouth 4 (four) times daily. 11/27/21  Yes Dacota Ruben A, PA-C  ?acetaminophen (TYLENOL) 500 MG tablet Take 500 mg by mouth every 6 (six) hours as needed for headache.    [provider]  ?ALFALFA PO Take 5 tablets by mouth 2 (two) times daily.    [provider]  ?Alpha-Lipoic Acid 200 MG CAPS Take 1 capsule by mouth 3 (three) times daily.    [provider]  ?anastrozole (ARIMIDEX) 1 MG tablet Take 1 mg by mouth daily.  11/14/17   [provider]  ?Ascorbic Acid (VITAMIN C PO) Take 1 tablet by mouth 3 (three) times daily. Vitamin C 2 (Sustained Release)    [provider]  ?b complex vitamins tablet Take 3-4 tablets by mouth 2 (two) times daily. Take 4 tables midday and Take 3 tablets in the evening.    [provider]  ?BLACK COHOSH PO Take 1 capsule by mouth 2 (two) times daily.    [provider]  ?CALCIUM PO Take 3 tablets by mouth 2 (two) times  daily. Osteomatrix    [provider]  ?cholecalciferol (VITAMIN D) 1000 units tablet Take 1,000-2,000 Units by mouth 3 (three) times daily. Take 1 tablet in the morning, Take 1 tablet at midday and Take 2 tablets in the evening.    [provider]  ?citalopram (CELEXA) 40 MG tablet Take 40 mg by mouth daily.  11/20/17   [provider]  ?Myrtis Ser PRIMROSE OIL PO Take 1,300 mg by mouth 2 (two) times daily.     [provider]  ?fluconazole (DIFLUCAN) 150 MG tablet Take 1 tablet as needed for vaginal yeast infection.  May repeat in 3 days if symptoms persist. 09/07/21   Molpus, Jenny Reichmann, MD  ?GARLIC PO Take 1 tablet by mouth 2 (two) times daily.    [provider]  ?ibuprofen (ADVIL,MOTRIN) 200 MG tablet Take 200 mg by mouth every 6 (six) hours as needed for headache.    [provider]  ?Iron-Vit C-Vit B12-Folic Acid (IRON 295 PLUS PO) Take 1-2 tablets by mouth 2 (two) times daily. Iron Plus. Take 2 tablets at midday and Take 1 tablet in the evening.    [provider]  ?LECITHIN PO Take 3 capsules by mouth daily.    [provider]  ?levofloxacin (LEVAQUIN) 750 MG tablet Take 1 tablet (750 mg total) by mouth daily. 09/07/21   Molpus, Jenny Reichmann,  MD  ?Melatonin 10 MG TABS Take 1 tablet by mouth at bedtime as needed (sleep).    [provider]  ?Misc Natural Products (JOINT HEALTH) CAPS Take 1 capsule by mouth 3 (three) times daily.    [provider]  ?Multiple Vitamins-Minerals (ZINC PO) Take 1 tablet by mouth 2 (two) times daily.    [provider]  ?Omega-3 1000 MG CAPS Take 6,000 mg by mouth daily.    [provider]  ?OVER THE COUNTER MEDICATION Take 1 capsule by mouth 2 (two) times daily. CoQHeart    [provider]  ?VITAMIN E PO Take 2 capsules by mouth 2 (two) times daily.    [provider]  ?   ? ?Allergies    ?Compazine [prochlorperazine], Prilosec [omeprazole], and Tramadol   ? ?Review of  Systems   ?Review of Systems  ?Constitutional:  Negative for chills and fever.  ?Respiratory:  Negative for shortness of breath.   ?Cardiovascular:  Negative for chest pain.  ?Skin:  Positive for color change and wound (Healing surgical incisions).  ?All other systems reviewed and are negative. ? ?Physical Exam ?Updated Vital Signs ?BP 139/84   Pulse 80   Temp 98.6 ?F (37 ?C)   Resp 18   SpO2 100%  ?Physical Exam ?Vitals and nursing note reviewed.  ?Constitutional:   ?   General: She is not in acute distress. ?   Appearance: She is not diaphoretic.  ?HENT:  ?   Head: Normocephalic and atraumatic.  ?   Mouth/Throat:  ?   Pharynx: No oropharyngeal exudate.  ?Eyes:  ?   General: No scleral icterus. ?   Conjunctiva/sclera: Conjunctivae normal.  ?Cardiovascular:  ?   Rate and Rhythm: Normal rate and regular rhythm.  ?   Pulses: Normal pulses.  ?   Heart sounds: Normal heart sounds.  ?Pulmonary:  ?   Effort: Pulmonary effort is normal. No respiratory distress.  ?   Breath sounds: Normal breath sounds. No wheezing.  ?Abdominal:  ?   General: Bowel sounds are normal.  ?   Palpations: Abdomen is soft. There is no mass.  ?   Tenderness: There is no abdominal tenderness. There is no guarding or rebound.  ?Musculoskeletal:     ?   General: Normal range of motion.  ?   Cervical back: Normal range of motion and neck supple.  ?Skin: ?   General: Skin is warm and dry.  ?   Findings: Erythema present.  ?   Comments: Erythema and increased warmth to left surgical site, no drainage noted. No dehiscence of bilateral mastectomy surgical sites noted. See picture below  ?Neurological:  ?   Mental Status: She is alert.  ?Psychiatric:     ?   Behavior: Behavior normal.  ? ? ? ? ?ED Results / Procedures / Treatments   ?Labs ?(all labs ordered are listed, but only abnormal results are displayed) ?Labs Reviewed  ?COMPREHENSIVE METABOLIC PANEL - Abnormal; Notable for the following components:  ?    Result Value  ? Potassium 3.3 (*)   ?  Glucose, Bld 113 (*)   ? ALT 77 (*)   ? All other components within normal limits  ?CBC WITH DIFFERENTIAL/PLATELET - Abnormal; Notable for the following components:  ? WBC 10.8 (*)   ? Neutro Abs 8.2 (*)   ? All other components within normal limits  ?CULTURE, BLOOD (ROUTINE X 2)  ?CULTURE, BLOOD (ROUTINE X 2)  ?LACTIC ACID, PLASMA  ?LACTIC  ACID, PLASMA  ?PROTIME-INR  ? ? ?EKG ?None ? ?Radiology ?DG Chest Port 1 View ? ?Result Date: 11/27/2021 ?CLINICAL DATA:  Recent double mastectomy. Swelling/redness at the drain site. EXAM: PORTABLE CHEST 1 VIEW COMPARISON:  None. FINDINGS: The heart size and mediastinal contours are within normal limits. Both lungs are clear. Bilateral chest wall surgical drains for recent mastectomy. IMPRESSION: 1. No acute cardiopulmonary process. 2. Bilateral mastectomy surgical drains, evaluation of soft tissues is limited on radiographs. Further evaluation with ultrasound or CT examination could be considered as clinically deemed necessary. Electronically Signed   By: Keane Police D.O.   On: 11/27/2021 18:46   ? ?Procedures ?Procedures  ? ? ?Medications Ordered in ED ?Medications  ?HYDROcodone-acetaminophen (NORCO/VICODIN) 5-325 MG per tablet 1 tablet (1 tablet Oral Given 11/27/21 2151)  ?cephALEXin (KEFLEX) capsule 500 mg (500 mg Oral Given 11/27/21 2149)  ? ? ?ED Course/ Medical Decision Making/ A&P ?Clinical Course as of 11/29/21 1404  ?Sun Nov 27, 2021  ?2000 WBC(!): 10.8 [SB]  ?2108 Consult with Dr. Judi Cong, surgical oncology through [facility] who recommends Keflex and follow up in clinic in the AM. [SB]  ?2133 Discussed treatment plan and discharge treatment plan with patient at bedside. Answered all questions. Pt agreeable with discharge treatment plan. Pt appears safe for discharge. [SB]  ?  ?Clinical Course User Index ?[SB] Benedicto Capozzi A, PA-C  ? ?                        ?Medical Decision Making ?Amount and/or Complexity of Data Reviewed ?Labs: ordered. Decision-making details documented  in ED Course. ? ?Risk ?Prescription drug management. ? ? ?Pt presents with left chest wall redness and drainage onset today.  Patient had a bilateral mastectomy due to breast cancer completed on 11/22/2021 by Dr. Marin Comment

## 2021-11-27 NOTE — Discharge Instructions (Addendum)
It was a pleasure taking care of you today!  ? ?Your labs were overall unremarkable. You will be sent a prescription for Keflex, take as prescribed. It is important that you call your surgeons office in the morning to set up a follow up appointment regarding todays ED visit. Return to the ED if you are experiencing increasing/worsening pain, drainage, trouble breathing, redness, or worsening symptoms. ?

## 2021-11-27 NOTE — ED Triage Notes (Signed)
Pt had double mastectomy on 18th, noticed today on the left side it is red/swelling at drain site. No fevers.  ?

## 2021-11-30 DIAGNOSIS — K219 Gastro-esophageal reflux disease without esophagitis: Secondary | ICD-10-CM | POA: Diagnosis not present

## 2021-11-30 DIAGNOSIS — F321 Major depressive disorder, single episode, moderate: Secondary | ICD-10-CM | POA: Diagnosis not present

## 2021-11-30 DIAGNOSIS — G43009 Migraine without aura, not intractable, without status migrainosus: Secondary | ICD-10-CM | POA: Diagnosis not present

## 2021-11-30 DIAGNOSIS — Z853 Personal history of malignant neoplasm of breast: Secondary | ICD-10-CM | POA: Diagnosis not present

## 2021-12-02 LAB — CULTURE, BLOOD (ROUTINE X 2)
Culture: NO GROWTH
Special Requests: ADEQUATE

## 2021-12-12 DIAGNOSIS — M96843 Postprocedural seroma of a musculoskeletal structure following other procedure: Secondary | ICD-10-CM | POA: Diagnosis not present

## 2021-12-12 DIAGNOSIS — Z853 Personal history of malignant neoplasm of breast: Secondary | ICD-10-CM | POA: Diagnosis not present

## 2021-12-12 DIAGNOSIS — Z923 Personal history of irradiation: Secondary | ICD-10-CM | POA: Diagnosis not present

## 2021-12-12 DIAGNOSIS — Z9013 Acquired absence of bilateral breasts and nipples: Secondary | ICD-10-CM | POA: Diagnosis not present

## 2021-12-14 DIAGNOSIS — L578 Other skin changes due to chronic exposure to nonionizing radiation: Secondary | ICD-10-CM | POA: Diagnosis not present

## 2021-12-14 DIAGNOSIS — L814 Other melanin hyperpigmentation: Secondary | ICD-10-CM | POA: Diagnosis not present

## 2021-12-14 DIAGNOSIS — D225 Melanocytic nevi of trunk: Secondary | ICD-10-CM | POA: Diagnosis not present

## 2021-12-14 DIAGNOSIS — L821 Other seborrheic keratosis: Secondary | ICD-10-CM | POA: Diagnosis not present

## 2022-01-17 DIAGNOSIS — Q631 Lobulated, fused and horseshoe kidney: Secondary | ICD-10-CM | POA: Diagnosis not present

## 2022-01-17 DIAGNOSIS — N1339 Other hydronephrosis: Secondary | ICD-10-CM | POA: Diagnosis not present

## 2022-01-31 DIAGNOSIS — M25571 Pain in right ankle and joints of right foot: Secondary | ICD-10-CM | POA: Diagnosis not present

## 2022-01-31 DIAGNOSIS — M546 Pain in thoracic spine: Secondary | ICD-10-CM | POA: Diagnosis not present

## 2022-01-31 DIAGNOSIS — M25572 Pain in left ankle and joints of left foot: Secondary | ICD-10-CM | POA: Diagnosis not present

## 2022-01-31 DIAGNOSIS — M62830 Muscle spasm of back: Secondary | ICD-10-CM | POA: Diagnosis not present

## 2022-02-02 DIAGNOSIS — C50912 Malignant neoplasm of unspecified site of left female breast: Secondary | ICD-10-CM | POA: Diagnosis not present

## 2022-02-02 DIAGNOSIS — N6312 Unspecified lump in the right breast, upper inner quadrant: Secondary | ICD-10-CM | POA: Diagnosis not present

## 2022-02-02 DIAGNOSIS — Z853 Personal history of malignant neoplasm of breast: Secondary | ICD-10-CM | POA: Diagnosis not present

## 2022-02-02 DIAGNOSIS — L7634 Postprocedural seroma of skin and subcutaneous tissue following other procedure: Secondary | ICD-10-CM | POA: Diagnosis not present

## 2022-02-02 DIAGNOSIS — Z9013 Acquired absence of bilateral breasts and nipples: Secondary | ICD-10-CM | POA: Diagnosis not present

## 2022-02-14 DIAGNOSIS — N10 Acute tubulo-interstitial nephritis: Secondary | ICD-10-CM | POA: Diagnosis not present

## 2022-02-14 DIAGNOSIS — Q631 Lobulated, fused and horseshoe kidney: Secondary | ICD-10-CM | POA: Diagnosis not present

## 2022-02-14 DIAGNOSIS — N130 Hydronephrosis with ureteropelvic junction obstruction: Secondary | ICD-10-CM | POA: Diagnosis not present

## 2022-02-15 DIAGNOSIS — Z853 Personal history of malignant neoplasm of breast: Secondary | ICD-10-CM | POA: Diagnosis not present

## 2022-02-15 DIAGNOSIS — Z08 Encounter for follow-up examination after completed treatment for malignant neoplasm: Secondary | ICD-10-CM | POA: Diagnosis not present

## 2022-02-15 DIAGNOSIS — D120 Benign neoplasm of cecum: Secondary | ICD-10-CM | POA: Diagnosis not present

## 2022-02-15 DIAGNOSIS — L7634 Postprocedural seroma of skin and subcutaneous tissue following other procedure: Secondary | ICD-10-CM | POA: Diagnosis not present

## 2022-02-15 DIAGNOSIS — Z9013 Acquired absence of bilateral breasts and nipples: Secondary | ICD-10-CM | POA: Diagnosis not present

## 2022-02-21 DIAGNOSIS — M62830 Muscle spasm of back: Secondary | ICD-10-CM | POA: Diagnosis not present

## 2022-02-21 DIAGNOSIS — M546 Pain in thoracic spine: Secondary | ICD-10-CM | POA: Diagnosis not present

## 2022-02-21 DIAGNOSIS — M25571 Pain in right ankle and joints of right foot: Secondary | ICD-10-CM | POA: Diagnosis not present

## 2022-02-21 DIAGNOSIS — M25572 Pain in left ankle and joints of left foot: Secondary | ICD-10-CM | POA: Diagnosis not present

## 2022-05-12 DIAGNOSIS — H9209 Otalgia, unspecified ear: Secondary | ICD-10-CM | POA: Diagnosis not present

## 2022-05-12 DIAGNOSIS — J329 Chronic sinusitis, unspecified: Secondary | ICD-10-CM | POA: Diagnosis not present

## 2022-06-01 DIAGNOSIS — Z124 Encounter for screening for malignant neoplasm of cervix: Secondary | ICD-10-CM | POA: Diagnosis not present

## 2022-06-01 DIAGNOSIS — Z8639 Personal history of other endocrine, nutritional and metabolic disease: Secondary | ICD-10-CM | POA: Diagnosis not present

## 2022-06-01 DIAGNOSIS — G43009 Migraine without aura, not intractable, without status migrainosus: Secondary | ICD-10-CM | POA: Diagnosis not present

## 2022-06-01 DIAGNOSIS — E7800 Pure hypercholesterolemia, unspecified: Secondary | ICD-10-CM | POA: Diagnosis not present

## 2022-06-01 DIAGNOSIS — F411 Generalized anxiety disorder: Secondary | ICD-10-CM | POA: Diagnosis not present

## 2022-06-01 DIAGNOSIS — Z808 Family history of malignant neoplasm of other organs or systems: Secondary | ICD-10-CM | POA: Diagnosis not present

## 2022-06-01 DIAGNOSIS — F321 Major depressive disorder, single episode, moderate: Secondary | ICD-10-CM | POA: Diagnosis not present

## 2022-06-01 DIAGNOSIS — Z0000 Encounter for general adult medical examination without abnormal findings: Secondary | ICD-10-CM | POA: Diagnosis not present

## 2022-06-06 DIAGNOSIS — D123 Benign neoplasm of transverse colon: Secondary | ICD-10-CM | POA: Diagnosis not present

## 2022-06-06 DIAGNOSIS — D128 Benign neoplasm of rectum: Secondary | ICD-10-CM | POA: Diagnosis not present

## 2022-06-06 DIAGNOSIS — Z86010 Personal history of colonic polyps: Secondary | ICD-10-CM | POA: Diagnosis not present

## 2022-06-06 DIAGNOSIS — K648 Other hemorrhoids: Secondary | ICD-10-CM | POA: Diagnosis not present

## 2022-06-06 DIAGNOSIS — K5730 Diverticulosis of large intestine without perforation or abscess without bleeding: Secondary | ICD-10-CM | POA: Diagnosis not present

## 2022-06-06 DIAGNOSIS — K6389 Other specified diseases of intestine: Secondary | ICD-10-CM | POA: Diagnosis not present

## 2022-06-19 DIAGNOSIS — C50911 Malignant neoplasm of unspecified site of right female breast: Secondary | ICD-10-CM | POA: Diagnosis not present

## 2022-06-19 DIAGNOSIS — Z9013 Acquired absence of bilateral breasts and nipples: Secondary | ICD-10-CM | POA: Diagnosis not present

## 2022-07-07 DIAGNOSIS — M79674 Pain in right toe(s): Secondary | ICD-10-CM | POA: Diagnosis not present

## 2022-08-09 ENCOUNTER — Ambulatory Visit
Admission: RE | Admit: 2022-08-09 | Discharge: 2022-08-09 | Disposition: A | Discharge status: Home / Self Care | Payer: BC Managed Care – PPO | Source: Ambulatory Visit | Attending: Family Medicine | Admitting: Family Medicine

## 2022-08-09 ENCOUNTER — Other Ambulatory Visit: Payer: Self-pay | Admitting: Family Medicine

## 2022-08-09 DIAGNOSIS — M79674 Pain in right toe(s): Secondary | ICD-10-CM | POA: Diagnosis not present

## 2022-11-07 DIAGNOSIS — L03818 Cellulitis of other sites: Secondary | ICD-10-CM | POA: Diagnosis not present

## 2022-11-07 DIAGNOSIS — R21 Rash and other nonspecific skin eruption: Secondary | ICD-10-CM | POA: Diagnosis not present

## 2022-11-07 DIAGNOSIS — B354 Tinea corporis: Secondary | ICD-10-CM | POA: Diagnosis not present

## 2022-12-01 DIAGNOSIS — Z853 Personal history of malignant neoplasm of breast: Secondary | ICD-10-CM | POA: Diagnosis not present

## 2022-12-01 DIAGNOSIS — F419 Anxiety disorder, unspecified: Secondary | ICD-10-CM | POA: Diagnosis not present

## 2022-12-01 DIAGNOSIS — F321 Major depressive disorder, single episode, moderate: Secondary | ICD-10-CM | POA: Diagnosis not present

## 2022-12-01 DIAGNOSIS — E7800 Pure hypercholesterolemia, unspecified: Secondary | ICD-10-CM | POA: Diagnosis not present

## 2022-12-19 ENCOUNTER — Ambulatory Visit: Payer: BC Managed Care – PPO | Admitting: Internal Medicine

## 2022-12-19 ENCOUNTER — Other Ambulatory Visit: Payer: Self-pay

## 2022-12-19 ENCOUNTER — Encounter: Payer: Self-pay | Admitting: Internal Medicine

## 2022-12-19 VITALS — BP 140/84 | HR 72 | Temp 97.5°F | Resp 12 | Ht 68.7 in | Wt 195.2 lb

## 2022-12-19 DIAGNOSIS — L299 Pruritus, unspecified: Secondary | ICD-10-CM

## 2022-12-19 DIAGNOSIS — J3089 Other allergic rhinitis: Secondary | ICD-10-CM

## 2022-12-19 DIAGNOSIS — Z9221 Personal history of antineoplastic chemotherapy: Secondary | ICD-10-CM | POA: Diagnosis not present

## 2022-12-19 MED ORDER — LORATADINE 10 MG PO TABS: 10.0000 mg | ORAL_TABLET | Freq: Every day | ORAL | 5 refills | Status: DC

## 2022-12-19 MED ORDER — AZELASTINE HCL 0.1 % NA SOLN: 1.0000 | Freq: Two times a day (BID) | NASAL | 5 refills | Status: DC | PRN

## 2022-12-19 MED ORDER — SARNA 0.5-0.5 % EX LOTN: 1.0000 | TOPICAL_LOTION | CUTANEOUS | 0 refills | Status: AC | PRN

## 2022-12-19 NOTE — Progress Notes (Signed)
NEW PATIENT  Date of Service/Encounter:  12/19/22  Consult requested by: Wilfrid Lund, PA   Subjective:   Amanda Martinez (DOB: February 16, 1966) is a 57 y.o. female who presents to the clinic on 12/19/2022 with a chief complaint of Establish Care and Pruritus (Hands and feet.) .    History obtained from: chart review and patient.   Pruritus: Started about 5 years ago with itchy hands and feet and now moving upward on her legs and arms.  No rashes, no history of chronic hives.  Worse at night when she is trying to sleep. Taking Claritin daily does seem to help and it has worsened since she held it the past few days.  Uses unscented sensitive skin products. No new medications. Did undergo chemotherapy with a taxel and had peripheral neuropathy requiring gabapentin in the past.  No tingling or numbness with the itching.  She wonders if this is an unknown food allergy.  Rhinitis:  Started in childhood.  Symptoms include: nasal congestion, rhinorrhea, sneezing, watery eyes, and itchy eyes  Occurs seasonally-Spring Potential triggers: pollen  Treatments tried:  Claritin daily. Held for over 3 days.   Previous allergy testing: no History of reflux/heartburn: yes, controlled with esomeprazole 10mg  four times a week  History of sinus surgery: no Nonallergic triggers: none   Past Medical History: Past Medical History:  Diagnosis Date   Cancer (HCC)    breast    Depression    Personal history of radiation therapy    Renal disorder    Urticaria    from medication reactions    Past Surgical History: Past Surgical History:  Procedure Laterality Date   BREAST LUMPECTOMY     FINGER SURGERY Right    little finger   KNEE SURGERY Right    SALPINGOOPHORECTOMY      Family History: Family History  Problem Relation Age of Onset   Asthma Mother    Allergic rhinitis Mother    Food Allergy Mother    Allergic rhinitis Daughter     Social History:  Lives in a 35 year house Flooring  in bedroom: carpet Pets: cat Tobacco use/exposure: none Job: caregiver  Medication List:  Allergies as of 12/19/2022       Reactions   Compazine [prochlorperazine] Hives   Prilosec [omeprazole] Hives   Tramadol         Medication List        Accurate as of Dec 19, 2022  2:52 PM. If you have any questions, ask your nurse or doctor.          acetaminophen 500 MG tablet Commonly known as: TYLENOL Take 500 mg by mouth every 6 (six) hours as needed for headache.   ALFALFA PO Take 5 tablets by mouth 2 (two) times daily.   Alpha-Lipoic Acid 200 MG Caps Take 1 capsule by mouth 3 (three) times daily.   anastrozole 1 MG tablet Commonly known as: ARIMIDEX Take 1 mg by mouth daily.   azelastine 0.1 % nasal spray Commonly known as: ASTELIN Place 1 spray into both nostrils 2 (two) times daily as needed. Use in each nostril as directed Started by: Birder Robson, MD   b complex vitamins tablet Take 3-4 tablets by mouth 2 (two) times daily. Take 4 tables midday and Take 3 tablets in the evening.   BLACK COHOSH PO Take 1 capsule by mouth 2 (two) times daily.   CALCIUM PO Take 3 tablets by mouth 2 (two) times daily. Osteomatrix  cephALEXin 500 MG capsule Commonly known as: KEFLEX Take 1 capsule (500 mg total) by mouth 4 (four) times daily.   cholecalciferol 1000 units tablet Commonly known as: VITAMIN D Take 1,000-2,000 Units by mouth 3 (three) times daily. Take 1 tablet in the morning, Take 1 tablet at midday and Take 2 tablets in the evening.   citalopram 40 MG tablet Commonly known as: CELEXA Take 40 mg by mouth daily.   EQL EVENING PRIMROSE OIL PO Take 1,300 mg by mouth 2 (two) times daily.   fluconazole 150 MG tablet Commonly known as: Diflucan Take 1 tablet as needed for vaginal yeast infection.  May repeat in 3 days if symptoms persist.   GARLIC PO Take 1 tablet by mouth 2 (two) times daily.   ibuprofen 200 MG tablet Commonly known as: ADVIL Take 200  mg by mouth every 6 (six) hours as needed for headache.   IRON 100 PLUS PO Take 1-2 tablets by mouth 2 (two) times daily. Iron Plus. Take 2 tablets at midday and Take 1 tablet in the evening.   Joint Health Caps Take 1 capsule by mouth 3 (three) times daily.   LECITHIN PO Take 3 capsules by mouth daily.   levofloxacin 750 MG tablet Commonly known as: Levaquin Take 1 tablet (750 mg total) by mouth daily.   loratadine 10 MG tablet Commonly known as: Claritin Take 1 tablet (10 mg total) by mouth daily. Started by: Birder Robson, MD   Melatonin 10 MG Tabs Take 1 tablet by mouth at bedtime as needed (sleep).   Omega-3 1000 MG Caps Take 6,000 mg by mouth daily.   OVER THE COUNTER MEDICATION Take 1 capsule by mouth 2 (two) times daily. CoQHeart   VITAMIN C PO Take 1 tablet by mouth 3 (three) times daily. Vitamin C 2 (Sustained Release)   VITAMIN E PO Take 2 capsules by mouth 2 (two) times daily.   ZINC PO Take 1 tablet by mouth 2 (two) times daily.         REVIEW OF SYSTEMS: Pertinent positives and negatives discussed in HPI.   Objective:   Physical Exam: BP (!) 140/84   Pulse 72   Temp (!) 97.5 F (36.4 C) (Temporal)   Resp 12   Ht 5' 8.7" (1.745 m)   Wt 195 lb 3.2 oz (88.5 kg)   SpO2 97%   BMI 29.08 kg/m  Body mass index is 29.08 kg/m. GEN: alert, well developed HEENT: clear conjunctiva, TM grey and translucent, nose with + inferior turbinate hypertrophy, pale nasal mucosa, slight clear rhinorrhea, + cobblestoning HEART: regular rate and rhythm, no murmur LUNGS: clear to auscultation bilaterally, no coughing, unlabored respiration ABDOMEN: soft, non distended  SKIN: no rashes or lesions  Reviewed:  06/19/2022: seen by PA Sizer for invasive ductal carcinoma of breast, stage 1A, HER2/ER/PR positive.  Underwent bl mastectomy and bl salpingo-oophorectomy. Underwent chemo and radiation.   12/14/2021: saw Dermatology Specialist PA for lentigo, seborrheic  keratosis, sun damaged skin and melanocytic nevi of trunk.  Reassurance and discussed SPF with yearly skin exam.   11/06/2022: seen by Horton Marshall PA [facility] for itching of hands and feet, no rashes, no changes in products.  Discussed oral anti histamines.  Referred to Allergy for further evaluation.    Skin Testing:  Skin prick testing was placed, which includes aeroallergens/foods, histamine control, and saline control.  Verbal consent was obtained prior to placing test.  Patient tolerated procedure well.  Allergy testing results were  read and interpreted by myself, documented by clinical staff. Adequate positive and negative control.  Results discussed with patient/family.  Airborne Adult Perc - 12/19/22 1403     Time Antigen Placed 1408    Allergen Manufacturer Waynette Buttery    Location Back    Number of Test 57    1. Control-Buffer 50% Glycerol Negative    2. Control-Histamine 1 mg/ml 3+    3. Albumin saline Negative    4. Bahia Negative    5. French Southern Territories Negative    6. Johnson Negative    7. Kentucky Blue Negative    8. Meadow Fescue Negative    9. Perennial Rye Negative    11. Timothy Negative    12. Cocklebur Negative    13. Burweed Marshelder Negative    14. Ragweed, short Negative    15. Ragweed, Giant Negative    16. Plantain,  English Negative    17. Lamb's Quarters Negative    18. Sheep Sorrell Negative    19. Rough Pigweed Negative    20. Marsh Elder, Rough Negative    21. Mugwort, Common 3+    22. Ash mix Negative    23. Birch mix Negative    24. Beech American Negative    25. Box, Elder Negative    26. Cedar, red Negative    27. Cottonwood, Guinea-Bissau Negative    28. Elm mix Negative    29. Hickory Negative    30. Maple mix Negative    31. Oak, Guinea-[place]issau mix Negative    32. Pecan Pollen Negative    33. Pine mix Negative    34. Sycamore Eastern Negative    35. Walnut, Black Pollen Negative    36. Alternaria alternata Negative    37. Cladosporium Herbarum Negative     38. Aspergillus mix Negative    39. Penicillium mix Negative    40. Bipolaris sorokiniana (Helminthosporium) Negative    41. Drechslera spicifera (Curvularia) Negative    42. Mucor plumbeus Negative    43. Fusarium moniliforme Negative    44. Aureobasidium pullulans (pullulara) Negative    46. Botrytis cinera Negative    47. Epicoccum nigrum Negative    48. Phoma betae Negative    49. Candida Albicans Negative    50. Trichophyton mentagrophytes Negative    51. Mite, D Farinae  5,000 AU/ml Negative    52. Mite, D Pteronyssinus  5,000 AU/ml Negative    53. Cat Hair 10,000 BAU/ml Negative    54.  Dog Epithelia Negative    55. Mixed Feathers Negative    56. Horse Epithelia Negative    57. Cockroach, German Negative    58. Mouse Negative    59. Tobacco Leaf Negative             Food Perc - 12/19/22 1403       Test Information   Time Antigen Placed 1408    Allergen Manufacturer Waynette Buttery    Location Back    Number of allergen test 10      Food   1. Peanut Negative    2. Soybean food Negative    3. Wheat, whole Negative    4. Sesame Negative    5. Milk, cow Negative    6. Egg White, chicken Negative    7. Casein Negative    8. Shellfish mix Negative    9. Fish mix Negative    10. Cashew Negative  Assessment:   1. Pruritus   2. History of chemotherapy   3. Other allergic rhinitis     Plan/Recommendations:  Pruritus - SPT positive 12/2022: mugwort, negative to commonly allergenic foods.  - Do a daily soaking tub bath in warm water for 10-15 minutes.  - Use a gentle, unscented cleanser at the end of the bath (such as Dove unscented bar or baby wash, or Aveeno sensitive body wash). Then rinse, pat half-way dry, and apply a gentle, unscented moisturizer cream or ointment (Cerave, Cetaphil, Eucerin, Aveeno)  all over while still damp. Dry skin makes the itching worse. The skin should be moisturized with a gentle, unscented moisturizer at least twice  daily.  - Use only unscented liquid laundry detergent. - Use Claritin 10mg  daily as needed for itching. - Consider Sarna anti-itch lotion.   - Differential includes neuropathy (hx of taxel use) but low suspicion since she does not have any tingling/numbness with it.   Allergic Rhinitis: - Due to turbinate hypertrophy, seasonal symptoms and unresponsive to OTC meds, performed skin testing to identify aeroallergen triggers.   - Positive skin test 12/2022: mugwort weed - Avoidance measures discussed. - Use nasal saline rinses before nose sprays such as with Neilmed Sinus Rinse.  Use distilled water.   - Use Azelastine 1-2 sprays each nostril twice daily as needed. Aim upward and outward. - Use Claritin 10 mg daily.   Return in about 3 months (around 03/21/2023).  Alesia Morin, MD [facility]

## 2022-12-19 NOTE — Patient Instructions (Addendum)
Pruritus - Do a daily soaking tub bath in warm water for 10-15 minutes.  - Use a gentle, unscented cleanser at the end of the bath (such as Dove unscented bar or baby wash, or Aveeno sensitive body wash). Then rinse, pat half-way dry, and apply a gentle, unscented moisturizer cream or ointment (Cerave, Cetaphil, Eucerin, Aveeno)  all over while still damp. Dry skin makes the itching worse. The skin should be moisturized with a gentle, unscented moisturizer at least twice daily.  - Use only unscented liquid laundry detergent. - Use Claritin 10mg  daily as needed for itching. - Consider Sarna anti-itch lotion.    Allergic Rhinitis: - Positive skin test 12/2022: mugwort weed - Avoidance measures discussed. - Use nasal saline rinses before nose sprays such as with Neilmed Sinus Rinse.  Use distilled water.   - Use Azelastine 1-2 sprays each nostril twice daily as needed. Aim upward and outward. - Use Claritin 10 mg daily.    ALLERGEN AVOIDANCE MEASURES  Pollen Avoidance Pollen levels are highest during the mid-day and afternoon.  Consider this when planning outdoor activities. Avoid being outside when the grass is being mowed, or wear a mask if the pollen-allergic person must be the one to mow the grass. Keep the windows closed to keep pollen outside of the home. Use an air conditioner to filter the air. Take a shower, wash hair, and change clothing after working or playing outdoors during pollen season.

## 2022-12-25 DIAGNOSIS — L578 Other skin changes due to chronic exposure to nonionizing radiation: Secondary | ICD-10-CM | POA: Diagnosis not present

## 2022-12-25 DIAGNOSIS — L821 Other seborrheic keratosis: Secondary | ICD-10-CM | POA: Diagnosis not present

## 2022-12-25 DIAGNOSIS — D225 Melanocytic nevi of trunk: Secondary | ICD-10-CM | POA: Diagnosis not present

## 2023-03-20 ENCOUNTER — Other Ambulatory Visit: Payer: Self-pay

## 2023-03-20 ENCOUNTER — Ambulatory Visit: Payer: BC Managed Care – PPO | Admitting: Internal Medicine

## 2023-03-20 ENCOUNTER — Encounter: Payer: Self-pay | Admitting: Internal Medicine

## 2023-03-20 VITALS — BP 132/80 | HR 72 | Temp 97.8°F | Resp 16 | Wt 196.3 lb

## 2023-03-20 DIAGNOSIS — J301 Allergic rhinitis due to pollen: Secondary | ICD-10-CM | POA: Diagnosis not present

## 2023-03-20 DIAGNOSIS — L299 Pruritus, unspecified: Secondary | ICD-10-CM

## 2023-03-20 DIAGNOSIS — J3089 Other allergic rhinitis: Secondary | ICD-10-CM | POA: Diagnosis not present

## 2023-03-20 MED ORDER — AZELASTINE HCL 0.1 % NA SOLN: 1.0000 | Freq: Two times a day (BID) | NASAL | 5 refills | Status: DC | PRN

## 2023-03-20 MED ORDER — LORATADINE 10 MG PO TABS: 10.0000 mg | ORAL_TABLET | Freq: Every day | ORAL | 5 refills | Status: AC

## 2023-03-20 NOTE — Progress Notes (Signed)
   FOLLOW UP Date of Service/Encounter:  03/20/23   Subjective:  Amanda Martinez (DOB: 1965-12-12) is a 57 y.o. female who [facility] for follow up for allergic rhinitis and pruritus.   History obtained from: chart review and patient. Last visit was on 12/19/2022 with me and the time was SPT positive only to mugwort.  Started on Claritin, Azelastine, Sarna lotion.    Since last visit, she reports doing better.  Did not try the Sarna lotion because her itching has done well on Claritin alone.  When she was visiting family in [place], she did not have any issues with her allergies.  Not as much congestion, drainage, runny nose.  Taking Claritin daily, did not use Azelastine much.  She is interested in blood testing for environmental allergens in case we missed anything on skin prick test.    Past Medical History: Past Medical History:  Diagnosis Date   Cancer (HCC)    breast    Depression    Personal history of radiation therapy    Renal disorder    Urticaria    from medication reactions    Objective:  BP 132/80   Pulse 72   Temp 97.8 F (36.6 C) (Temporal)   Resp 16   Wt 196 lb 4.8 oz (89 kg)   SpO2 97%   BMI 29.24 kg/m  Body mass index is 29.24 kg/m. Physical Exam: GEN: alert, well developed HEENT: clear conjunctiva, TM grey and translucent, nose with mild inferior turbinate hypertrophy, pink nasal mucosa, no rhinorrhea, no cobblestoning HEART: regular rate and rhythm, no murmur LUNGS: clear to auscultation bilaterally, no coughing, unlabored respiration SKIN: no rashes or lesions  Assessment:   1. Non-seasonal allergic rhinitis due to pollen   2. Pruritus     Plan/Recommendations:  Pruritus - Do a daily soaking tub bath in warm water for 10-15 minutes.  - Use a gentle, unscented cleanser at the end of the bath (such as Dove unscented bar or baby wash, or Aveeno sensitive body wash). Then rinse, pat half-way dry, and  apply a gentle, unscented moisturizer cream or ointment (Cerave, Cetaphil, Eucerin, Aveeno)  all over while still damp. Dry skin makes the itching worse. The skin should be moisturized with a gentle, unscented moisturizer at least twice daily.  - Use only unscented liquid laundry detergent. - Use Claritin 10mg  daily.  - Consider Sarna anti-itch lotion.    Allergic Rhinitis: - Positive skin test 12/2022: mugwort weed.  Will obtain bloodwork to identify other aeroallergens in setting of only reactivity to mugwort on SPT.   - Avoidance measures discussed. - Use nasal saline rinses before nose sprays such as with Neilmed Sinus Rinse.  Use distilled water.   - Use Azelastine 1-2 sprays each nostril twice daily as needed. Aim upward and outward. - Use Claritin 10 mg daily.     Return in about 6 months (around 09/20/2023).  Alesia Morin, MD [facility]

## 2023-03-20 NOTE — Patient Instructions (Addendum)
Pruritus - Do a daily soaking tub bath in warm water for 10-15 minutes.  - Use a gentle, unscented cleanser at the end of the bath (such as Dove unscented bar or baby wash, or Aveeno sensitive body wash). Then rinse, pat half-way dry, and apply a gentle, unscented moisturizer cream or ointment (Cerave, Cetaphil, Eucerin, Aveeno)  all over while still damp. Dry skin makes the itching worse. The skin should be moisturized with a gentle, unscented moisturizer at least twice daily.  - Use only unscented liquid laundry detergent. - Use Claritin 10mg  daily.  - Consider Sarna anti-itch lotion.    Allergic Rhinitis: - Positive skin test 12/2022: mugwort weed.  Will obtain bloodwork to identify other aeroallergen.   - Avoidance measures discussed. - Use nasal saline rinses before nose sprays such as with Neilmed Sinus Rinse.  Use distilled water.   - Use Azelastine 1-2 sprays each nostril twice daily as needed. Aim upward and outward. - Use Claritin 10 mg daily.

## 2023-03-24 LAB — ALLERGENS W/TOTAL IGE AREA 2
Cedar, Mountain IgE: <0.1 kU/L
Common Silver Birch IgE: <0.1 kU/L
IgE (Immunoglobulin E), Serum: 40 IU/mL (ref 6–495)
Oak, White IgE: <0.1 kU/L

## 2023-06-14 DIAGNOSIS — C50911 Malignant neoplasm of unspecified site of right female breast: Secondary | ICD-10-CM | POA: Diagnosis not present

## 2023-06-14 DIAGNOSIS — Z170 Estrogen receptor positive status [ER+]: Secondary | ICD-10-CM | POA: Diagnosis not present

## 2023-06-14 DIAGNOSIS — Z9013 Acquired absence of bilateral breasts and nipples: Secondary | ICD-10-CM | POA: Diagnosis not present

## 2023-07-12 DIAGNOSIS — Z0000 Encounter for general adult medical examination without abnormal findings: Secondary | ICD-10-CM | POA: Diagnosis not present

## 2023-07-12 DIAGNOSIS — F321 Major depressive disorder, single episode, moderate: Secondary | ICD-10-CM | POA: Diagnosis not present

## 2023-07-12 DIAGNOSIS — K219 Gastro-esophageal reflux disease without esophagitis: Secondary | ICD-10-CM | POA: Diagnosis not present

## 2023-07-12 DIAGNOSIS — G43009 Migraine without aura, not intractable, without status migrainosus: Secondary | ICD-10-CM | POA: Diagnosis not present

## 2023-07-12 DIAGNOSIS — E559 Vitamin D deficiency, unspecified: Secondary | ICD-10-CM | POA: Diagnosis not present

## 2023-07-12 DIAGNOSIS — E7800 Pure hypercholesterolemia, unspecified: Secondary | ICD-10-CM | POA: Diagnosis not present

## 2023-07-27 DIAGNOSIS — H52203 Unspecified astigmatism, bilateral: Secondary | ICD-10-CM | POA: Diagnosis not present

## 2023-07-27 DIAGNOSIS — H5213 Myopia, bilateral: Secondary | ICD-10-CM | POA: Diagnosis not present

## 2023-07-27 DIAGNOSIS — H353111 Nonexudative age-related macular degeneration, right eye, early dry stage: Secondary | ICD-10-CM | POA: Diagnosis not present

## 2023-07-27 DIAGNOSIS — H2513 Age-related nuclear cataract, bilateral: Secondary | ICD-10-CM | POA: Diagnosis not present

## 2023-07-27 DIAGNOSIS — H43813 Vitreous degeneration, bilateral: Secondary | ICD-10-CM | POA: Diagnosis not present

## 2023-07-27 DIAGNOSIS — H25013 Cortical age-related cataract, bilateral: Secondary | ICD-10-CM | POA: Diagnosis not present

## 2023-08-30 DIAGNOSIS — M25572 Pain in left ankle and joints of left foot: Secondary | ICD-10-CM | POA: Diagnosis not present

## 2023-08-30 DIAGNOSIS — M25571 Pain in right ankle and joints of right foot: Secondary | ICD-10-CM | POA: Diagnosis not present

## 2023-08-30 DIAGNOSIS — M546 Pain in thoracic spine: Secondary | ICD-10-CM | POA: Diagnosis not present

## 2023-08-30 DIAGNOSIS — M9904 Segmental and somatic dysfunction of sacral region: Secondary | ICD-10-CM | POA: Diagnosis not present

## 2023-09-21 ENCOUNTER — Ambulatory Visit: Payer: BC Managed Care – PPO | Admitting: Internal Medicine

## 2023-09-21 ENCOUNTER — Other Ambulatory Visit: Payer: Self-pay

## 2023-09-21 ENCOUNTER — Encounter: Payer: Self-pay | Admitting: Internal Medicine

## 2023-09-21 VITALS — BP 130/80 | HR 92 | Temp 98.2°F | Ht 68.19 in | Wt 200.0 lb

## 2023-09-21 DIAGNOSIS — J3089 Other allergic rhinitis: Secondary | ICD-10-CM

## 2023-09-21 DIAGNOSIS — J302 Other seasonal allergic rhinitis: Secondary | ICD-10-CM

## 2023-09-21 DIAGNOSIS — L299 Pruritus, unspecified: Secondary | ICD-10-CM

## 2023-09-21 MED ORDER — AZELASTINE HCL 0.1 % NA SOLN: 1.0000 | Freq: Two times a day (BID) | NASAL | 11 refills | Status: AC | PRN

## 2023-09-21 NOTE — Patient Instructions (Addendum)
Pruritus - Do a daily soaking tub bath in warm water for 10-15 minutes.  - Use a gentle, unscented cleanser at the end of the bath (such as Dove unscented bar or baby wash, or Aveeno sensitive body wash). Then rinse, pat half-way dry, and apply a gentle, unscented moisturizer cream or ointment (Cerave, Cetaphil, Eucerin, Aveeno)  all over while still damp. Dry skin makes the itching worse. The skin should be moisturized with a gentle, unscented moisturizer at least twice daily.  - Use only unscented liquid laundry detergent. - Use Claritin 10mg  daily.  - Consider Sarna anti-itch lotion if symptoms worsen.   Allergic Rhinitis: - Positive skin test 12/2022: mugwort weed.  sIgE 03/2023: dust mites, [place] grass. - Use nasal saline rinses before nose sprays such as with Neilmed Sinus Rinse.  Use distilled water.   - Use Azelastine 1-2 sprays each nostril twice daily as needed. Aim upward and outward. - Use Claritin 10 mg daily.    ALLERGEN AVOIDANCE MEASURES   Dust Mites Use central air conditioning and heat; and change the filter monthly.  Pleated filters work better than mesh filters.  Electrostatic filters may also be used; wash the filter monthly.  Window air conditioners may be used, but do not clean the air as well as a central air conditioner.  Change or wash the filter monthly. Keep windows closed.  Do not use attic fans.   Encase the mattress, box springs and pillows with zippered, dust proof covers. Wash the bed linens in hot water weekly.   Remove carpet, especially from the bedroom. Remove stuffed animals, throw pillows, dust ruffles, heavy drapes and other items that collect dust from the bedroom. Do not use a humidifier.   Use wood, vinyl or leather furniture instead of cloth furniture in the bedroom. Keep the indoor humidity at 30 - 40%.    Pollen Avoidance Pollen levels are highest during the mid-day and afternoon.  Consider this when planning outdoor activities. Avoid being  outside when the grass is being mowed, or wear a mask if the pollen-allergic person must be the one to mow the grass. Keep the windows closed to keep pollen outside of the home. Use an air conditioner to filter the air. Take a shower, wash hair, and change clothing after working or playing outdoors during pollen season.

## 2023-09-21 NOTE — Progress Notes (Signed)
FOLLOW UP Date of Service/Encounter:  09/21/23   Subjective:  Amanda Martinez (DOB: 1966/01/04) is a 58 y.o. female who [facility] for follow up for pruritus and allergic rhinitis.   History obtained from: chart review and patient. Last visit was with me on 03/20/2023 and at the time, we obtained bloodwork for aeroallergens that showed reactivity to dust mites, [place] grass.  Discussed use of Azelastine, Claritin, anti itch Sarna lotion.   Reports since last visit, she has been practicing avoidance measure for her aeroallergens and doing a lot better.  Not much congestion, drainage but does have slight runny nose. Using Azelastine PRN and Claritin daily. Not much trouble with itching either.  She moisturizes regularly and takes Claritin.  Has not needed the sarna lotion.  Past Medical History: Past Medical History:  Diagnosis Date   Cancer (HCC)    breast    Depression    Personal history of radiation therapy    Renal disorder    Urticaria    from medication reactions    Objective:  BP 130/80 (BP Location: Left Arm, Patient Position: Sitting, Cuff Size: Normal)   Pulse 92   Temp 98.2 F (36.8 C) (Temporal)   Ht 5' 8.19" (1.732 m)   Wt 200 lb (90.7 kg)   SpO2 96%   BMI 30.24 kg/m  Body mass index is 30.24 kg/m. Physical Exam: GEN: alert, well developed HEENT: clear conjunctiva, nose with mild inferior turbinate hypertrophy, pink nasal mucosa, + slight clear rhinorrhea, no cobblestoning HEART: regular rate and rhythm, no murmur LUNGS: clear to auscultation bilaterally, no coughing, unlabored respiration SKIN: no rashes or lesions  Assessment:   1. Seasonal and perennial allergic rhinitis   2. Pruritus     Plan/Recommendations:  Pruritus - Well controlled on daily anti histamine.  - Do a daily soaking tub bath in warm water for 10-15 minutes.  - Use a gentle, unscented cleanser at the end of the bath (such as Dove unscented  bar or baby wash, or Aveeno sensitive body wash). Then rinse, pat half-way dry, and apply a gentle, unscented moisturizer cream or ointment (Cerave, Cetaphil, Eucerin, Aveeno)  all over while still damp. Dry skin makes the itching worse. The skin should be moisturized with a gentle, unscented moisturizer at least twice daily.  - Use only unscented liquid laundry detergent. - Use Claritin 10mg  daily.  - Consider Sarna anti-itch lotion.   - Has a hx of taxel use but no other symptoms of neuropathy.   Allergic Rhinitis: - Well controlled.  - Positive skin test 12/2022: mugwort weed.  sIgE 03/2023: dust mites, [place] grass. - Use nasal saline rinses before nose sprays such as with Neilmed Sinus Rinse.  Use distilled water.   - Use Azelastine 1-2 sprays each nostril twice daily as needed. Aim upward and outward. - Use Claritin 10 mg daily.    ALLERGEN AVOIDANCE MEASURES   Dust Mites Use central air conditioning and heat; and change the filter monthly.  Pleated filters work better than mesh filters.  Electrostatic filters may also be used; wash the filter monthly.  Window air conditioners may be used, but do not clean the air as well as a central air conditioner.  Change or wash the filter monthly. Keep windows closed.  Do not use attic fans.   Encase the mattress, box springs and pillows with zippered, dust proof covers. Wash the bed linens in hot water weekly.   Remove carpet,  especially from the bedroom. Remove stuffed animals, throw pillows, dust ruffles, heavy drapes and other items that collect dust from the bedroom. Do not use a humidifier.   Use wood, vinyl or leather furniture instead of cloth furniture in the bedroom. Keep the indoor humidity at 30 - 40%.    Pollen Avoidance Pollen levels are highest during the mid-day and afternoon.  Consider this when planning outdoor activities. Avoid being outside when the grass is being mowed, or wear a mask if the pollen-allergic person must  be the one to mow the grass. Keep the windows closed to keep pollen outside of the home. Use an air conditioner to filter the air. Take a shower, wash hair, and change clothing after working or playing outdoors during pollen season.    Return in about 1 year (around 09/20/2024).  Alesia Morin, MD [facility]

## 2024-01-14 DIAGNOSIS — D225 Melanocytic nevi of trunk: Secondary | ICD-10-CM | POA: Diagnosis not present

## 2024-01-14 DIAGNOSIS — L821 Other seborrheic keratosis: Secondary | ICD-10-CM | POA: Diagnosis not present

## 2024-01-14 DIAGNOSIS — Z9013 Acquired absence of bilateral breasts and nipples: Secondary | ICD-10-CM | POA: Diagnosis not present

## 2024-01-14 DIAGNOSIS — L578 Other skin changes due to chronic exposure to nonionizing radiation: Secondary | ICD-10-CM | POA: Diagnosis not present

## 2024-02-18 DIAGNOSIS — M25511 Pain in right shoulder: Secondary | ICD-10-CM | POA: Diagnosis not present

## 2024-03-06 DIAGNOSIS — M25511 Pain in right shoulder: Secondary | ICD-10-CM | POA: Diagnosis not present

## 2024-03-20 DIAGNOSIS — S46011A Strain of muscle(s) and tendon(s) of the rotator cuff of right shoulder, initial encounter: Secondary | ICD-10-CM | POA: Diagnosis not present

## 2024-04-28 DIAGNOSIS — G8918 Other acute postprocedural pain: Secondary | ICD-10-CM | POA: Diagnosis not present

## 2024-04-28 DIAGNOSIS — M7501 Adhesive capsulitis of right shoulder: Secondary | ICD-10-CM | POA: Diagnosis not present

## 2024-04-28 DIAGNOSIS — S43431A Superior glenoid labrum lesion of right shoulder, initial encounter: Secondary | ICD-10-CM | POA: Diagnosis not present

## 2024-04-28 DIAGNOSIS — S46211A Strain of muscle, fascia and tendon of other parts of biceps, right arm, initial encounter: Secondary | ICD-10-CM | POA: Diagnosis not present

## 2024-04-28 DIAGNOSIS — X58XXXA Exposure to other specified factors, initial encounter: Secondary | ICD-10-CM | POA: Diagnosis not present

## 2024-04-28 DIAGNOSIS — Y999 Unspecified external cause status: Secondary | ICD-10-CM | POA: Diagnosis not present

## 2024-04-28 DIAGNOSIS — M7541 Impingement syndrome of right shoulder: Secondary | ICD-10-CM | POA: Diagnosis not present

## 2024-04-28 DIAGNOSIS — S46011A Strain of muscle(s) and tendon(s) of the rotator cuff of right shoulder, initial encounter: Secondary | ICD-10-CM | POA: Diagnosis not present

## 2024-04-28 DIAGNOSIS — M24111 Other articular cartilage disorders, right shoulder: Secondary | ICD-10-CM | POA: Diagnosis not present

## 2024-04-29 DIAGNOSIS — M25511 Pain in right shoulder: Secondary | ICD-10-CM | POA: Diagnosis not present

## 2024-05-02 DIAGNOSIS — M25511 Pain in right shoulder: Secondary | ICD-10-CM | POA: Diagnosis not present

## 2024-05-09 DIAGNOSIS — M25511 Pain in right shoulder: Secondary | ICD-10-CM | POA: Diagnosis not present

## 2024-05-16 DIAGNOSIS — M25511 Pain in right shoulder: Secondary | ICD-10-CM | POA: Diagnosis not present

## 2024-05-20 DIAGNOSIS — M25511 Pain in right shoulder: Secondary | ICD-10-CM | POA: Diagnosis not present

## 2024-05-30 DIAGNOSIS — M25511 Pain in right shoulder: Secondary | ICD-10-CM | POA: Diagnosis not present

## 2024-06-05 DIAGNOSIS — M25511 Pain in right shoulder: Secondary | ICD-10-CM | POA: Diagnosis not present

## 2024-06-10 DIAGNOSIS — M25511 Pain in right shoulder: Secondary | ICD-10-CM | POA: Diagnosis not present

## 2024-06-16 DIAGNOSIS — F419 Anxiety disorder, unspecified: Secondary | ICD-10-CM | POA: Diagnosis not present

## 2024-06-16 DIAGNOSIS — F32A Depression, unspecified: Secondary | ICD-10-CM | POA: Diagnosis not present

## 2024-06-20 DIAGNOSIS — M25511 Pain in right shoulder: Secondary | ICD-10-CM | POA: Diagnosis not present

## 2024-06-27 DIAGNOSIS — M25511 Pain in right shoulder: Secondary | ICD-10-CM | POA: Diagnosis not present

## 2024-07-01 DIAGNOSIS — M25511 Pain in right shoulder: Secondary | ICD-10-CM | POA: Diagnosis not present

## 2024-07-10 DIAGNOSIS — M25511 Pain in right shoulder: Secondary | ICD-10-CM | POA: Diagnosis not present

## 2024-07-17 DIAGNOSIS — M25511 Pain in right shoulder: Secondary | ICD-10-CM | POA: Diagnosis not present
